# Patient Record
Sex: Male | Born: 1965 | Race: Black or African American | Hispanic: No | Marital: Married | Smoking: Current some day smoker
Health system: Southern US, Community
[De-identification: ages and names within clinical notes are randomized; demographics above are authoritative.]

## PROBLEM LIST (undated history)

## (undated) DIAGNOSIS — K219 Gastro-esophageal reflux disease without esophagitis: Secondary | ICD-10-CM

## (undated) DIAGNOSIS — F32A Depression, unspecified: Secondary | ICD-10-CM

## (undated) DIAGNOSIS — I10 Essential (primary) hypertension: Secondary | ICD-10-CM

## (undated) DIAGNOSIS — F419 Anxiety disorder, unspecified: Secondary | ICD-10-CM

## (undated) DIAGNOSIS — F329 Major depressive disorder, single episode, unspecified: Secondary | ICD-10-CM

## (undated) DIAGNOSIS — E119 Type 2 diabetes mellitus without complications: Secondary | ICD-10-CM

## (undated) DIAGNOSIS — R519 Headache, unspecified: Secondary | ICD-10-CM

## (undated) DIAGNOSIS — R51 Headache: Secondary | ICD-10-CM

---

## 2005-07-31 HISTORY — PX: FOOT MASS EXCISION: SHX1663

## 2005-12-13 ENCOUNTER — Ambulatory Visit: Payer: Self-pay

## 2008-03-23 ENCOUNTER — Ambulatory Visit: Payer: Self-pay | Admitting: Chiropractor

## 2008-11-30 IMAGING — MR MRI LUMBAR SPINE WITHOUT CONTRAST
2 series · 36 of 48 positions shown · non-contrast
Comparison: none

REASON FOR EXAM: lumbar and leg pain
COMMENTS:

PROCEDURE:     MR  - MR LUMBAR SPINE WO CONTRAST  - March 23, 2008  [DATE]
RESULT:
HISTORY: Chronic low back pain with radiation into LEFT leg and bilateral
foot numbness.

[Series 5: T2 · axial · 5.0mm · 0.86mm/px · z∈[-157,+161]mm · 24 of 31 slices shown]
[im 1/31]
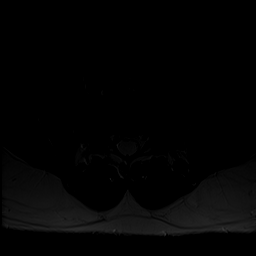
[im 2/31]
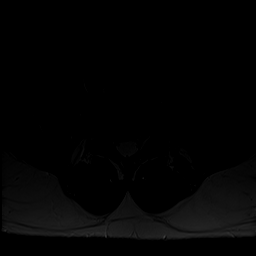
[im 3/31]
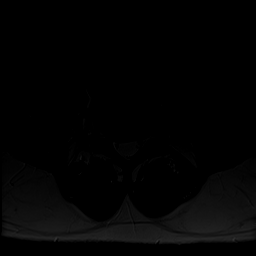
[im 4/31]
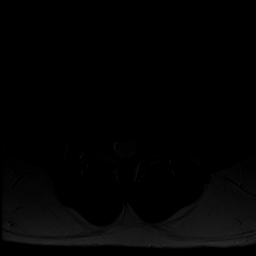
[im 6/31]
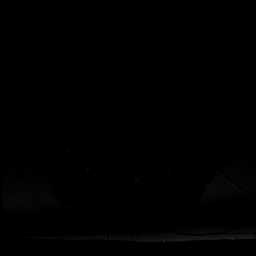
[im 7/31]
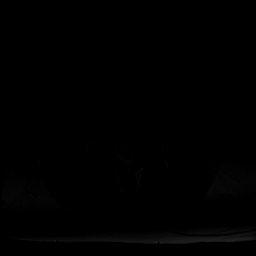
[im 8/31]
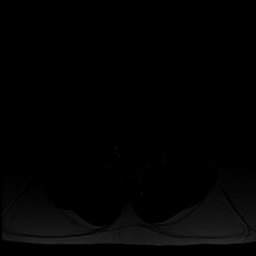
[im 10/31]
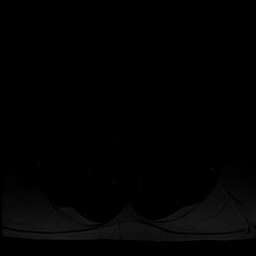
[im 11/31]
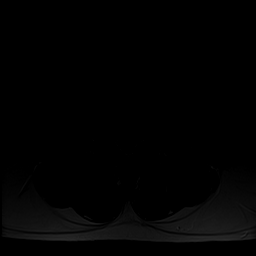
[im 12/31]
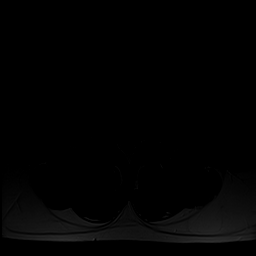
[im 14/31]
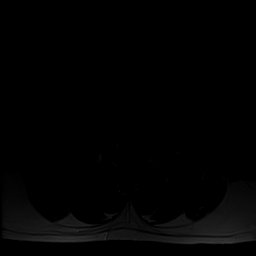
[im 15/31]
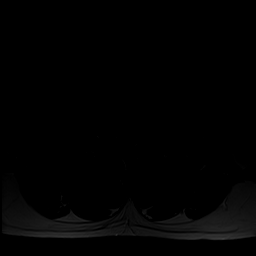
[im 16/31]
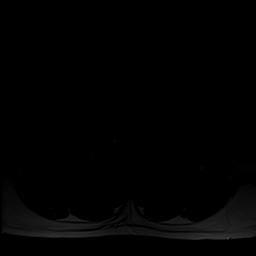
[im 17/31]
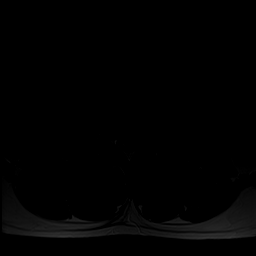
[im 19/31]
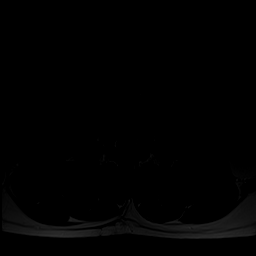
[im 20/31]
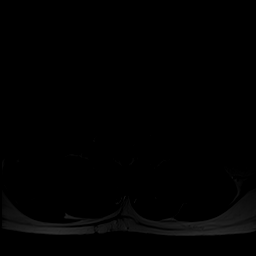
[im 21/31]
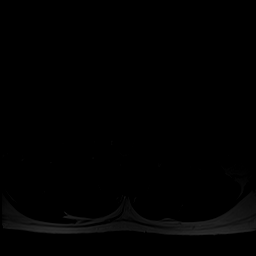
[im 23/31]
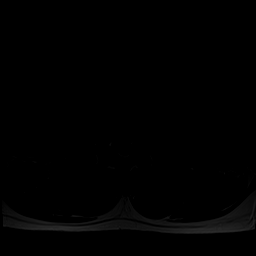
[im 24/31]
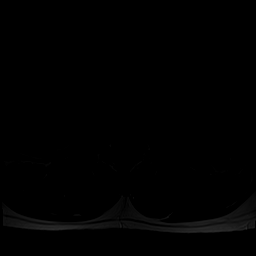
[im 25/31]
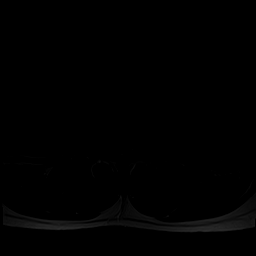
[im 27/31]
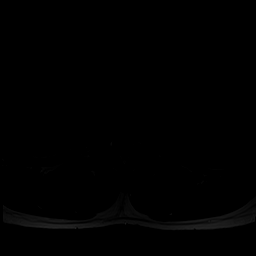
[im 28/31]
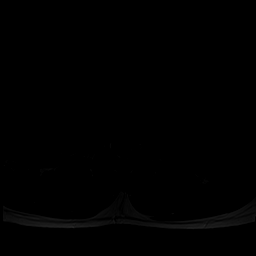
[im 29/31]
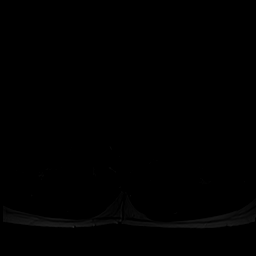
[im 31/31]
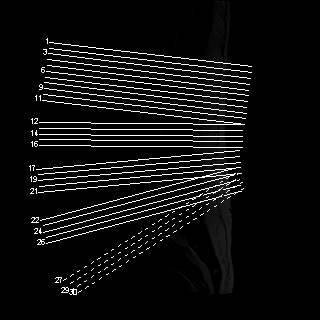

[Series 6: T1 · axial · 5.0mm · 0.43mm/px · z∈[-157,+110]mm · 12 of 31 slices shown]
[im 1/31]
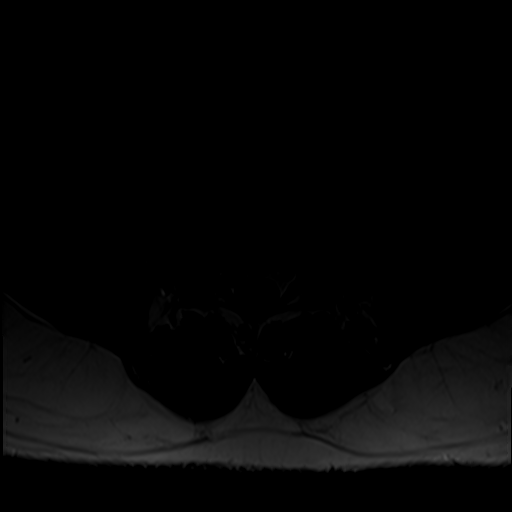
[im 2/31]
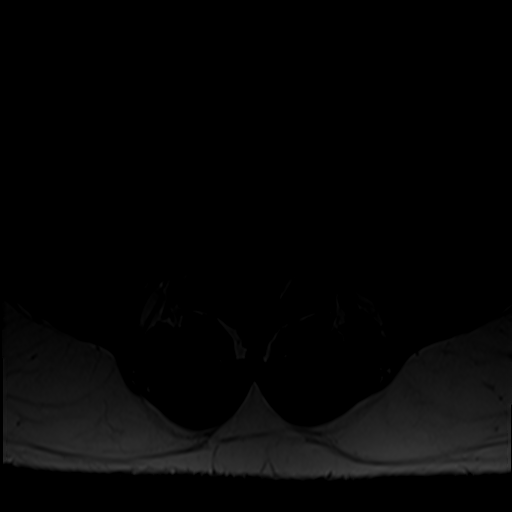
[im 3/31]
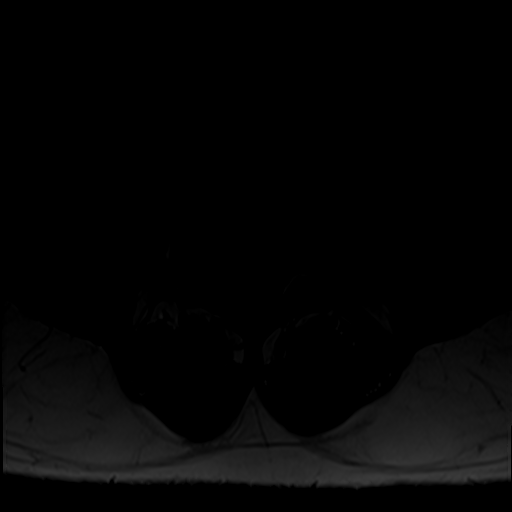
[im 6/31]
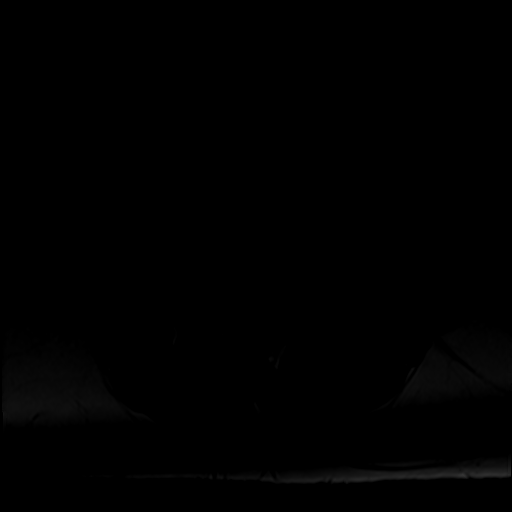
[im 10/31]
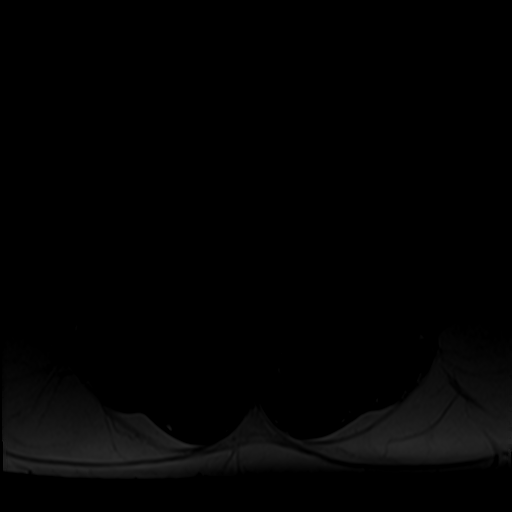
[im 14/31]
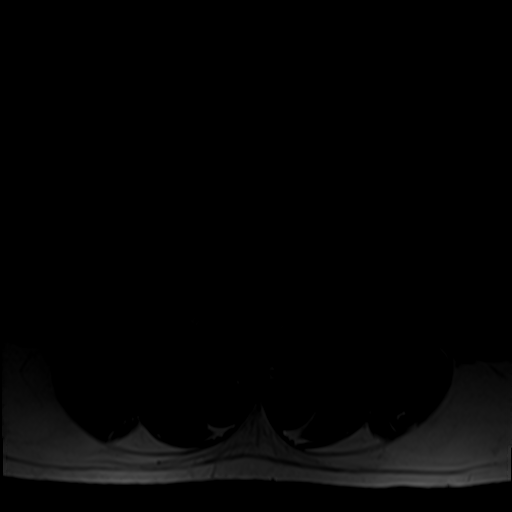
[im 16/31]
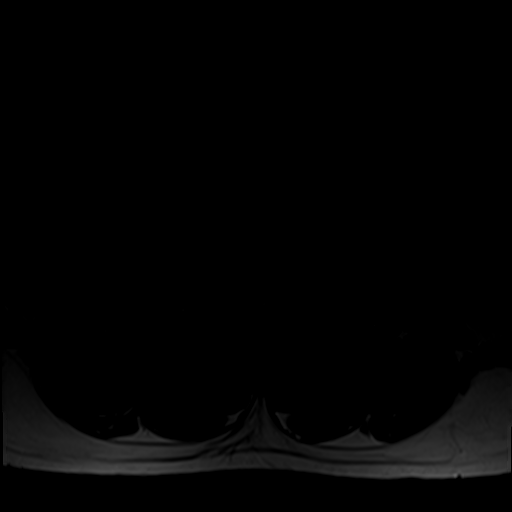
[im 17/31]
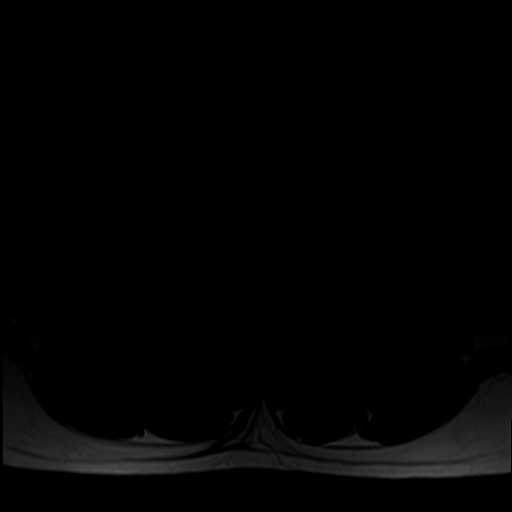
[im 21/31]
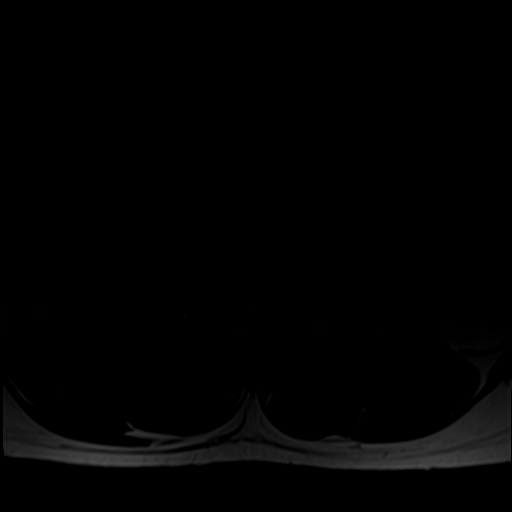
[im 25/31]
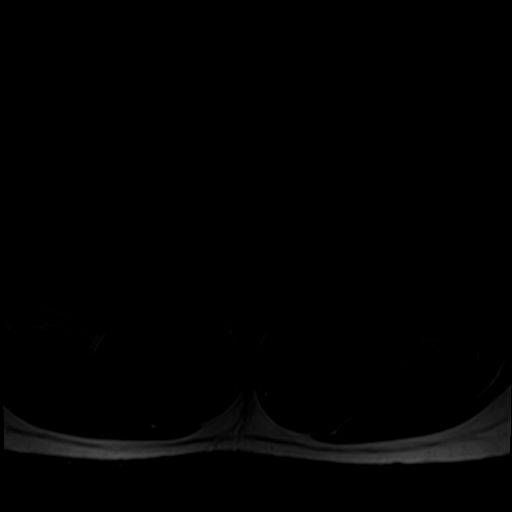
[im 27/31]
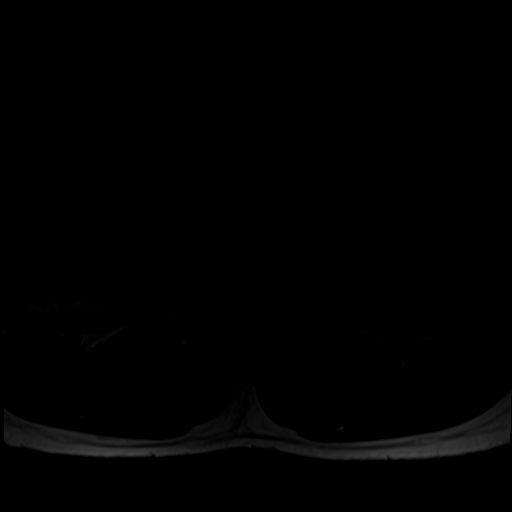
[im 29/31]
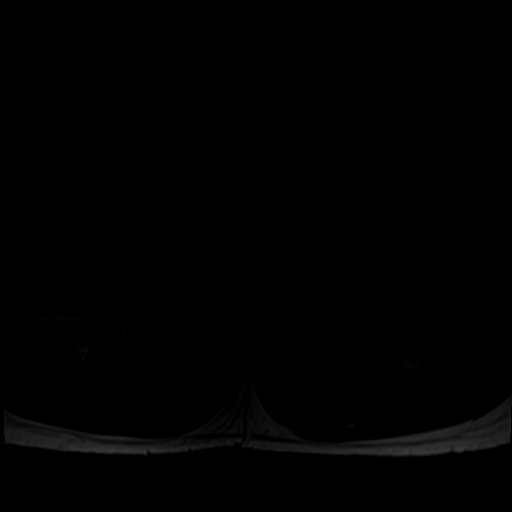

[36 of 48 positions shown; findings below may reference images not displayed]

COMPARISON STUDIES: No recent.

PROCEDURE AND FINDINGS: Multiplanar/multisequence imaging of the lumbar
spine is obtained.  A RIGHT paracentral L1-L2 disc protrusion is present.
This results in mild deformity of the thecal sac at this level. No evidence
of nerve root compression at this level.  A RIGHT paracentral annular bulge
and/or disc protrusion is noted. This along with facetal hypertrophy results
in mild flattening of the RIGHT lateral recess at this level. The neural
foramen is present at this level. Annular bulge with a possible small
central disc protrusion and bilateral facetal hypertrophy is present at
L4-L5 with mild thecal sac deformity and mild bilateral flattening of the
lateral recesses at this level. No high grade stenosis is noted.  Central to
RIGHT paracentral disc protrusion at L5-S1 with flattening of the RIGHT
lateral recess at this level. The lumbar cord is normal. No bony lesion is
noted.
IMPRESSION: 1.Multilevel degenerative disc disease with multilevel annular bulges and
disc protrusions and facetal hypertrophy.  See changes at individual levels
as described above.

## 2010-02-07 ENCOUNTER — Ambulatory Visit: Payer: Self-pay | Admitting: Gastroenterology

## 2010-04-21 ENCOUNTER — Ambulatory Visit: Payer: Self-pay | Admitting: Internal Medicine

## 2010-11-21 ENCOUNTER — Inpatient Hospital Stay: Payer: Self-pay | Admitting: Internal Medicine

## 2012-08-15 ENCOUNTER — Ambulatory Visit: Payer: Self-pay

## 2012-08-25 ENCOUNTER — Ambulatory Visit: Payer: Self-pay

## 2012-09-08 ENCOUNTER — Ambulatory Visit: Payer: Self-pay | Admitting: Physician Assistant

## 2013-08-15 ENCOUNTER — Encounter (INDEPENDENT_AMBULATORY_CARE_PROVIDER_SITE_OTHER): Payer: BC Managed Care – PPO | Admitting: Ophthalmology

## 2013-08-15 DIAGNOSIS — H43819 Vitreous degeneration, unspecified eye: Secondary | ICD-10-CM

## 2013-08-15 DIAGNOSIS — I10 Essential (primary) hypertension: Secondary | ICD-10-CM

## 2013-08-15 DIAGNOSIS — E11319 Type 2 diabetes mellitus with unspecified diabetic retinopathy without macular edema: Secondary | ICD-10-CM

## 2013-08-15 DIAGNOSIS — H35039 Hypertensive retinopathy, unspecified eye: Secondary | ICD-10-CM

## 2013-08-15 DIAGNOSIS — E1165 Type 2 diabetes mellitus with hyperglycemia: Secondary | ICD-10-CM

## 2013-08-15 DIAGNOSIS — E1139 Type 2 diabetes mellitus with other diabetic ophthalmic complication: Secondary | ICD-10-CM

## 2013-08-15 DIAGNOSIS — H33309 Unspecified retinal break, unspecified eye: Secondary | ICD-10-CM

## 2013-08-15 DIAGNOSIS — H2510 Age-related nuclear cataract, unspecified eye: Secondary | ICD-10-CM

## 2015-04-12 ENCOUNTER — Encounter (INDEPENDENT_AMBULATORY_CARE_PROVIDER_SITE_OTHER): Payer: BLUE CROSS/BLUE SHIELD | Admitting: Ophthalmology

## 2015-04-12 DIAGNOSIS — H43813 Vitreous degeneration, bilateral: Secondary | ICD-10-CM | POA: Diagnosis not present

## 2015-04-12 DIAGNOSIS — E11319 Type 2 diabetes mellitus with unspecified diabetic retinopathy without macular edema: Secondary | ICD-10-CM

## 2015-04-12 DIAGNOSIS — H35413 Lattice degeneration of retina, bilateral: Secondary | ICD-10-CM | POA: Diagnosis not present

## 2015-04-12 DIAGNOSIS — I10 Essential (primary) hypertension: Secondary | ICD-10-CM

## 2015-04-12 DIAGNOSIS — E11329 Type 2 diabetes mellitus with mild nonproliferative diabetic retinopathy without macular edema: Secondary | ICD-10-CM | POA: Diagnosis not present

## 2015-04-12 DIAGNOSIS — H35033 Hypertensive retinopathy, bilateral: Secondary | ICD-10-CM

## 2015-04-12 DIAGNOSIS — H33303 Unspecified retinal break, bilateral: Secondary | ICD-10-CM | POA: Diagnosis not present

## 2015-04-12 DIAGNOSIS — H2513 Age-related nuclear cataract, bilateral: Secondary | ICD-10-CM

## 2015-04-21 ENCOUNTER — Encounter (INDEPENDENT_AMBULATORY_CARE_PROVIDER_SITE_OTHER): Payer: BLUE CROSS/BLUE SHIELD | Admitting: Ophthalmology

## 2015-04-21 DIAGNOSIS — H33303 Unspecified retinal break, bilateral: Secondary | ICD-10-CM | POA: Diagnosis not present

## 2015-04-28 ENCOUNTER — Ambulatory Visit (INDEPENDENT_AMBULATORY_CARE_PROVIDER_SITE_OTHER): Payer: BLUE CROSS/BLUE SHIELD | Admitting: Ophthalmology

## 2015-04-28 DIAGNOSIS — H33303 Unspecified retinal break, bilateral: Secondary | ICD-10-CM

## 2015-09-01 ENCOUNTER — Ambulatory Visit (INDEPENDENT_AMBULATORY_CARE_PROVIDER_SITE_OTHER): Payer: BLUE CROSS/BLUE SHIELD | Admitting: Ophthalmology

## 2015-09-01 DIAGNOSIS — H35033 Hypertensive retinopathy, bilateral: Secondary | ICD-10-CM

## 2015-09-01 DIAGNOSIS — I10 Essential (primary) hypertension: Secondary | ICD-10-CM | POA: Diagnosis not present

## 2015-09-01 DIAGNOSIS — E113293 Type 2 diabetes mellitus with mild nonproliferative diabetic retinopathy without macular edema, bilateral: Secondary | ICD-10-CM

## 2015-09-01 DIAGNOSIS — H43813 Vitreous degeneration, bilateral: Secondary | ICD-10-CM | POA: Diagnosis not present

## 2015-09-01 DIAGNOSIS — H33303 Unspecified retinal break, bilateral: Secondary | ICD-10-CM | POA: Diagnosis not present

## 2015-09-01 DIAGNOSIS — E11319 Type 2 diabetes mellitus with unspecified diabetic retinopathy without macular edema: Secondary | ICD-10-CM

## 2015-09-01 DIAGNOSIS — H2513 Age-related nuclear cataract, bilateral: Secondary | ICD-10-CM

## 2015-10-11 ENCOUNTER — Ambulatory Visit: Payer: Self-pay | Admitting: Family Medicine

## 2016-03-22 ENCOUNTER — Other Ambulatory Visit: Payer: Self-pay | Admitting: Podiatry

## 2016-03-22 DIAGNOSIS — M722 Plantar fascial fibromatosis: Secondary | ICD-10-CM

## 2016-03-22 DIAGNOSIS — E119 Type 2 diabetes mellitus without complications: Secondary | ICD-10-CM | POA: Diagnosis not present

## 2016-03-31 ENCOUNTER — Ambulatory Visit
Admission: RE | Admit: 2016-03-31 | Discharge: 2016-03-31 | Disposition: A | Discharge status: Home / Self Care | Payer: BLUE CROSS/BLUE SHIELD | Source: Ambulatory Visit | Attending: Podiatry | Admitting: Podiatry

## 2016-03-31 ENCOUNTER — Other Ambulatory Visit: Payer: Self-pay | Admitting: Podiatry

## 2016-03-31 DIAGNOSIS — M79672 Pain in left foot: Secondary | ICD-10-CM | POA: Diagnosis not present

## 2016-03-31 DIAGNOSIS — M722 Plantar fascial fibromatosis: Secondary | ICD-10-CM

## 2016-03-31 LAB — POCT I-STAT CREATININE: CREATININE: 1 mg/dL (ref 0.61–1.24)

## 2016-05-15 ENCOUNTER — Other Ambulatory Visit: Payer: BLUE CROSS/BLUE SHIELD

## 2016-06-05 ENCOUNTER — Ambulatory Visit
Admission: RE | Admit: 2016-06-05 | Discharge: 2016-06-05 | Disposition: A | Discharge status: Home / Self Care | Payer: BLUE CROSS/BLUE SHIELD | Source: Ambulatory Visit | Attending: Podiatry | Admitting: Podiatry

## 2016-06-05 DIAGNOSIS — M722 Plantar fascial fibromatosis: Secondary | ICD-10-CM | POA: Diagnosis not present

## 2016-06-05 DIAGNOSIS — Z01810 Encounter for preprocedural cardiovascular examination: Secondary | ICD-10-CM | POA: Insufficient documentation

## 2016-06-05 DIAGNOSIS — E119 Type 2 diabetes mellitus without complications: Secondary | ICD-10-CM | POA: Insufficient documentation

## 2016-06-05 DIAGNOSIS — I10 Essential (primary) hypertension: Secondary | ICD-10-CM | POA: Insufficient documentation

## 2016-06-05 DIAGNOSIS — Z01812 Encounter for preprocedural laboratory examination: Secondary | ICD-10-CM | POA: Insufficient documentation

## 2016-06-05 HISTORY — DX: Depression, unspecified: F32.A

## 2016-06-05 HISTORY — DX: Anxiety disorder, unspecified: F41.9

## 2016-06-05 HISTORY — DX: Headache: R51

## 2016-06-05 HISTORY — DX: Gastro-esophageal reflux disease without esophagitis: K21.9

## 2016-06-05 HISTORY — DX: Type 2 diabetes mellitus without complications: E11.9

## 2016-06-05 HISTORY — DX: Major depressive disorder, single episode, unspecified: F32.9

## 2016-06-05 HISTORY — DX: Essential (primary) hypertension: I10

## 2016-06-05 HISTORY — DX: Headache, unspecified: R51.9

## 2016-06-05 LAB — BASIC METABOLIC PANEL
Anion gap: 9 (ref 5–15)
BUN: 14 mg/dL (ref 6–20)
CHLORIDE: 106 mmol/L (ref 101–111)
CO2: 27 mmol/L (ref 22–32)
CREATININE: 0.94 mg/dL (ref 0.61–1.24)
Calcium: 9.2 mg/dL (ref 8.9–10.3)
GFR calc Af Amer: >60 mL/min (ref >60)
GFR calc non Af Amer: >60 mL/min (ref >60)
GLUCOSE: 105 mg/dL — AB (ref 65–99)
POTASSIUM: 3.8 mmol/L (ref 3.5–5.1)
Sodium: 142 mmol/L (ref 135–145)

## 2016-06-05 LAB — CBC
HEMATOCRIT: 44.3 % (ref 40.0–52.0)
HEMOGLOBIN: 15 g/dL (ref 13.0–18.0)
MCH: 27.6 pg (ref 26.0–34.0)
MCHC: 33.8 g/dL (ref 32.0–36.0)
MCV: 81.4 fL (ref 80.0–100.0)
PLATELETS: 185 10*3/uL (ref 150–440)
RBC: 5.44 MIL/uL (ref 4.40–5.90)
RDW: 16.5 % — ABNORMAL HIGH (ref 11.5–14.5)
WBC: 5.9 10*3/uL (ref 3.8–10.6)

## 2016-06-05 LAB — DIFFERENTIAL
Basophils Absolute: 0.1 10*3/uL (ref 0–0.1)
Basophils Relative: 1 %
EOS PCT: 2 %
Eosinophils Absolute: 0.1 10*3/uL (ref 0–0.7)
LYMPHS ABS: 1.7 10*3/uL (ref 1.0–3.6)
LYMPHS PCT: 30 %
MONO ABS: 0.5 10*3/uL (ref 0.2–1.0)
MONOS PCT: 9 %
NEUTROS ABS: 3.4 10*3/uL (ref 1.4–6.5)
Neutrophils Relative %: 58 %

## 2016-06-05 NOTE — Patient Instructions (Signed)
  Your procedure is scheduled [id number] Nov. 10 , 2017. Report to Same Day Surgery. To find out your arrival time please call [phone] between 1PM - 3PM on Thursday Nov. 9, 2017.  Remember: Instructions that are not followed completely may result in serious medical risk, up to and including death, or upon the discretion of your surgeon and anesthesiologist your surgery may need to be rescheduled.    _x___ 1. Do not eat food or drink liquids after midnight. No gum chewing or hard candies.     _x_ 2. No Alcohol for 24 hours before or after surgery.   ____ 3. Bring all medications with you on the day of surgery if instructed.    __x__ 4. Notify your doctor if there is any change in your medical condition     (cold, fever, infections).    __x___ 5. No smoking 24 hours prior to surgery.     Do not wear jewelry, make-up, hairpins, clips or nail polish.  Do not wear lotions, powders, or perfumes.   Do not shave 48 hours prior to surgery. Men may shave face and neck.  Do not bring valuables to the hospital.    [facility] is not responsible for any belongings or valuables.               Contacts, dentures or bridgework may not be worn into surgery.  Leave your suitcase in the car. After surgery it may be brought to your room.  For patients admitted to the hospital, discharge time is determined by your treatment team.   Patients discharged the day of surgery will not be allowed to drive home.    Please read over the following fact sheets that you were given:   [facility] Preparing for Surgery  __x__ Take these medicines the morning of surgery with A SIP OF WATER:    1. amLODipine (NORVASC)  2. omeprazole (PRILOSEC)  3. quinapril (ACCUPRIL)   ____ Fleet Enema (as directed)   __x__ Use CHG Soap as directed on instruction sheet  ____ Use inhalers on the day of surgery and bring to hospital day of surgery  __x__ Stop Janumet/metformin 2 days prior to surgery    ____ Take 1/2  of usual insulin dose the night before surgery and none on the morning of surgery.   ____ Stop Coumadin/Plavix/aspirin on does not apply.  _x__ Stop Anti-inflammatories such as :etodolac (LODINE XL),  Advil, Aleve, Ibuprofen, Motrin, Naproxen, Naprosyn, Goodies powders or aspirin products.  OK to take or Tramadol or  Percocet orTylenol.   _X___ Stop supplements;Melatonin-Pyridoxine   until after surgery.    ____ Bring C-Pap to the hospital.

## 2016-06-06 DIAGNOSIS — M722 Plantar fascial fibromatosis: Secondary | ICD-10-CM | POA: Diagnosis not present

## 2016-06-06 LAB — HEMOGLOBIN A1C
HEMOGLOBIN A1C: 7.6 % — AB (ref 4.8–5.6)
MEAN PLASMA GLUCOSE: 171 mg/dL

## 2016-06-06 NOTE — Pre-Procedure Instructions (Signed)
Requested H&P.

## 2016-06-06 NOTE — Pre-Procedure Instructions (Signed)
HGB A1C sent to Dr. Cleda Mccreedy for review.

## 2016-06-09 ENCOUNTER — Encounter
Admission: RE | Disposition: A | Discharge status: Home / Self Care | Payer: Self-pay | Source: Ambulatory Visit | Attending: Podiatry

## 2016-06-09 ENCOUNTER — Ambulatory Visit
Admission: RE | Admit: 2016-06-09 | Discharge: 2016-06-09 | Disposition: A | Discharge status: Home / Self Care | Payer: BLUE CROSS/BLUE SHIELD | Source: Ambulatory Visit | Attending: Podiatry | Admitting: Podiatry

## 2016-06-09 ENCOUNTER — Ambulatory Visit: Payer: BLUE CROSS/BLUE SHIELD | Admitting: Registered Nurse

## 2016-06-09 DIAGNOSIS — K219 Gastro-esophageal reflux disease without esophagitis: Secondary | ICD-10-CM | POA: Diagnosis not present

## 2016-06-09 DIAGNOSIS — F329 Major depressive disorder, single episode, unspecified: Secondary | ICD-10-CM | POA: Insufficient documentation

## 2016-06-09 DIAGNOSIS — D2122 Benign neoplasm of connective and other soft tissue of left lower limb, including hip: Secondary | ICD-10-CM | POA: Insufficient documentation

## 2016-06-09 DIAGNOSIS — F419 Anxiety disorder, unspecified: Secondary | ICD-10-CM | POA: Diagnosis not present

## 2016-06-09 DIAGNOSIS — F418 Other specified anxiety disorders: Secondary | ICD-10-CM | POA: Diagnosis not present

## 2016-06-09 DIAGNOSIS — I10 Essential (primary) hypertension: Secondary | ICD-10-CM | POA: Insufficient documentation

## 2016-06-09 DIAGNOSIS — M722 Plantar fascial fibromatosis: Secondary | ICD-10-CM | POA: Insufficient documentation

## 2016-06-09 DIAGNOSIS — E119 Type 2 diabetes mellitus without complications: Secondary | ICD-10-CM | POA: Diagnosis not present

## 2016-06-09 DIAGNOSIS — R2242 Localized swelling, mass and lump, left lower limb: Secondary | ICD-10-CM | POA: Diagnosis not present

## 2016-06-09 DIAGNOSIS — Z7984 Long term (current) use of oral hypoglycemic drugs: Secondary | ICD-10-CM | POA: Diagnosis not present

## 2016-06-09 HISTORY — PX: PLANTAR FASCIA RELEASE: SHX2239

## 2016-06-09 LAB — GLUCOSE, CAPILLARY
Glucose-Capillary: 159 mg/dL — ABNORMAL HIGH (ref 65–99)
Glucose-Capillary: 123 mg/dL — ABNORMAL HIGH (ref 65–99)

## 2016-06-09 SURGERY — RELEASE, FASCIA, PLANTAR
Anesthesia: General | Laterality: Left

## 2016-06-09 MED ORDER — PROPOFOL 10 MG/ML IV BOLUS
INTRAVENOUS | Status: DC | PRN
  Administered 2016-06-09: 200 mg via INTRAVENOUS

## 2016-06-09 MED ORDER — BUPIVACAINE HCL (PF) 0.5 % IJ SOLN
INTRAMUSCULAR | Status: DC | PRN
  Administered 2016-06-09: 20 mL

## 2016-06-09 MED ORDER — DEXAMETHASONE SODIUM PHOSPHATE 10 MG/ML IJ SOLN
INTRAMUSCULAR | Status: AC
  Filled 2016-06-09: qty 1

## 2016-06-09 MED ORDER — ONDANSETRON HCL 4 MG/2ML IJ SOLN
4.0000 mg | Freq: Once | INTRAMUSCULAR | Status: AC | PRN
  Administered 2016-06-09: 4 mg via INTRAVENOUS

## 2016-06-09 MED ORDER — FAMOTIDINE 20 MG PO TABS
ORAL_TABLET | ORAL | Status: AC
  Administered 2016-06-09: 20 mg
  Filled 2016-06-09: qty 1

## 2016-06-09 MED ORDER — OXYCODONE-ACETAMINOPHEN 5-325 MG PO TABS: 1.0000 | ORAL_TABLET | ORAL | Status: DC | PRN

## 2016-06-09 MED ORDER — CEFAZOLIN SODIUM-DEXTROSE 2-4 GM/100ML-% IV SOLN
INTRAVENOUS | Status: AC
  Filled 2016-06-09: qty 100

## 2016-06-09 MED ORDER — ACETAMINOPHEN 10 MG/ML IV SOLN
INTRAVENOUS | Status: AC
  Filled 2016-06-09: qty 100

## 2016-06-09 MED ORDER — LIDOCAINE HCL (CARDIAC) 20 MG/ML IV SOLN
INTRAVENOUS | Status: DC | PRN
  Administered 2016-06-09: 80 mg via INTRAVENOUS

## 2016-06-09 MED ORDER — LIDOCAINE HCL (PF) 1 % IJ SOLN
INTRAMUSCULAR | Status: AC
Start: 2016-06-09 — End: 2016-06-09
  Filled 2016-06-09: qty 30

## 2016-06-09 MED ORDER — SODIUM CHLORIDE 0.9 % IV SOLN
INTRAVENOUS | Status: DC
  Administered 2016-06-09: 07:00:00 via INTRAVENOUS

## 2016-06-09 MED ORDER — FENTANYL CITRATE (PF) 100 MCG/2ML IJ SOLN
25.0000 ug | INTRAMUSCULAR | Status: DC | PRN
  Administered 2016-06-09 (×2): 25 ug via INTRAVENOUS

## 2016-06-09 MED ORDER — BUPIVACAINE HCL (PF) 0.5 % IJ SOLN
INTRAMUSCULAR | Status: AC
  Filled 2016-06-09: qty 30

## 2016-06-09 MED ORDER — FENTANYL CITRATE (PF) 100 MCG/2ML IJ SOLN
INTRAMUSCULAR | Status: DC | PRN
  Administered 2016-06-09: 25 ug via INTRAVENOUS
  Administered 2016-06-09: 50 ug via INTRAVENOUS
  Administered 2016-06-09 (×2): 25 ug via INTRAVENOUS
  Administered 2016-06-09: 50 ug via INTRAVENOUS
  Administered 2016-06-09: 25 ug via INTRAVENOUS

## 2016-06-09 MED ORDER — OXYCODONE-ACETAMINOPHEN 5-325 MG PO TABS: 1.0000 | ORAL_TABLET | ORAL | 0 refills | Status: DC | PRN

## 2016-06-09 MED ORDER — ACETAMINOPHEN 10 MG/ML IV SOLN
INTRAVENOUS | Status: DC | PRN
  Administered 2016-06-09: 1000 mg via INTRAVENOUS

## 2016-06-09 MED ORDER — ONDANSETRON HCL 4 MG/2ML IJ SOLN
INTRAMUSCULAR | Status: DC | PRN
  Administered 2016-06-09: 4 mg via INTRAVENOUS

## 2016-06-09 MED ORDER — CEFAZOLIN SODIUM-DEXTROSE 2-4 GM/100ML-% IV SOLN
2.0000 g | Freq: Once | INTRAVENOUS | Status: AC
  Administered 2016-06-09: 2 g via INTRAVENOUS

## 2016-06-09 MED ORDER — FAMOTIDINE 20 MG PO TABS: 20.0000 mg | ORAL_TABLET | Freq: Once | ORAL | Status: DC

## 2016-06-09 MED ORDER — METOCLOPRAMIDE HCL 5 MG/ML IJ SOLN
INTRAMUSCULAR | Status: AC
  Filled 2016-06-09: qty 2

## 2016-06-09 MED ORDER — ONDANSETRON HCL 4 MG/2ML IJ SOLN
INTRAMUSCULAR | Status: AC
  Filled 2016-06-09: qty 2

## 2016-06-09 MED ORDER — METOCLOPRAMIDE HCL 5 MG/ML IJ SOLN
10.0000 mg | Freq: Once | INTRAMUSCULAR | Status: AC
  Administered 2016-06-09: 10 mg via INTRAVENOUS

## 2016-06-09 MED ORDER — KETOROLAC TROMETHAMINE 30 MG/ML IJ SOLN
INTRAMUSCULAR | Status: DC | PRN
  Administered 2016-06-09: 30 mg via INTRAVENOUS

## 2016-06-09 MED ORDER — FENTANYL CITRATE (PF) 100 MCG/2ML IJ SOLN
INTRAMUSCULAR | Status: DC
Start: 2016-06-09 — End: 2016-06-09
  Filled 2016-06-09: qty 2

## 2016-06-09 MED ORDER — MIDAZOLAM HCL 2 MG/2ML IJ SOLN
INTRAMUSCULAR | Status: DC | PRN
Start: 2016-06-09 — End: 2016-06-09
  Administered 2016-06-09: 2 mg via INTRAVENOUS

## 2016-06-09 MED ORDER — DEXAMETHASONE SODIUM PHOSPHATE 10 MG/ML IJ SOLN
10.0000 mg | Freq: Once | INTRAMUSCULAR | Status: AC
  Administered 2016-06-09: 10 mg via INTRAVENOUS

## 2016-06-09 SURGICAL SUPPLY — 34 items
BANDAGE ELASTIC 4 LF NS (GAUZE/BANDAGES/DRESSINGS) ×4 IMPLANT
BANDAGE STRETCH 3X4.1 STRL (GAUZE/BANDAGES/DRESSINGS) ×2 IMPLANT
BNDG ESMARK 4X12 TAN STRL LF (GAUZE/BANDAGES/DRESSINGS) ×2 IMPLANT
BNDG GAUZE 4.5X4.1 6PLY STRL (MISCELLANEOUS) ×2 IMPLANT
CANISTER SUCT 1200ML W/VALVE (MISCELLANEOUS) ×2 IMPLANT
CUFF TOURN SGL QUICK 18 (TOURNIQUET CUFF) ×2 IMPLANT
CUFF TOURN SGL QUICK 24 (TOURNIQUET CUFF)
CUFF TRNQT CYL 24X4X40X1 (TOURNIQUET CUFF) IMPLANT
DURAPREP 26ML APPLICATOR (WOUND CARE) ×2 IMPLANT
ELECT REM PT RETURN 9FT ADLT (ELECTROSURGICAL) ×2
ELECTRODE REM PT RTRN 9FT ADLT (ELECTROSURGICAL) ×1 IMPLANT
GAUZE PETRO XEROFOAM 1X8 (MISCELLANEOUS) ×2 IMPLANT
GAUZE SPONGE 4X4 12PLY STRL (GAUZE/BANDAGES/DRESSINGS) ×2 IMPLANT
GAUZE STRETCH 2X75IN STRL (MISCELLANEOUS) ×2 IMPLANT
GLOVE BIO SURGEON STRL SZ7.5 (GLOVE) ×2 IMPLANT
GLOVE INDICATOR 8.0 STRL GRN (GLOVE) ×2 IMPLANT
GOWN STRL REUS W/ TWL LRG LVL3 (GOWN DISPOSABLE) ×2 IMPLANT
GOWN STRL REUS W/TWL LRG LVL3 (GOWN DISPOSABLE) ×2
KIT RM TURNOVER STRD PROC AR (KITS) ×2 IMPLANT
LABEL OR SOLS (LABEL) IMPLANT
NDL SAFETY ECLIPSE 18X1.5 (NEEDLE) ×1 IMPLANT
NEEDLE HYPO 18GX1.5 SHARP (NEEDLE) ×1
NEEDLE HYPO 25X1 1.5 SAFETY (NEEDLE) ×2 IMPLANT
NS IRRIG 500ML POUR BTL (IV SOLUTION) ×2 IMPLANT
PACK EXTREMITY ARMC (MISCELLANEOUS) ×2 IMPLANT
PAD PREP 24X41 OB/GYN DISP (PERSONAL CARE ITEMS) ×2 IMPLANT
PENCIL ELECTRO HAND CTR (MISCELLANEOUS) ×2 IMPLANT
STOCKINETTE M/LG 89821 (MISCELLANEOUS) ×2 IMPLANT
SUT ETHILON 4-0 (SUTURE) ×2
SUT ETHILON 4-0 FS2 18XMFL BLK (SUTURE) ×2
SUT ETHILON NAB PS2 4-0 18IN (SUTURE) ×2 IMPLANT
SUT VIC AB 4-0 FS2 27 (SUTURE) ×8 IMPLANT
SUTURE ETHLN 4-0 FS2 18XMF BLK (SUTURE) ×2 IMPLANT
SYRINGE 10CC LL (SYRINGE) ×2 IMPLANT

## 2016-06-09 NOTE — Anesthesia Postprocedure Evaluation (Signed)
Anesthesia Post Note  Patient: Clinton Moore  Procedure(s) Performed: Procedure(s) (LRB): Radical excision plantar fibromas of left plantar arch and left hallux (Left)  Patient location during evaluation: PACU Anesthesia Type: General Level of consciousness: awake and alert Pain management: pain level controlled Vital Signs Assessment: post-procedure vital signs reviewed and stable Respiratory status: spontaneous breathing and respiratory function stable Cardiovascular status: stable Anesthetic complications: no    Last Vitals:  Vitals:   06/09/16 0620 06/09/16 0940  BP: (!) 146/109 118/76  Pulse: 97 85  Resp: 18 14  Temp: 36.3 C 36.2 C    Last Pain:  Vitals:   06/09/16 0620  TempSrc: Tympanic                 KEPHART,Ziyad K

## 2016-06-09 NOTE — Anesthesia Preprocedure Evaluation (Signed)
Anesthesia Evaluation  Patient identified by MRN, date of birth, ID band Patient awake    Reviewed: Allergy & Precautions, NPO status , Patient's Chart, lab work & pertinent test results  History of Anesthesia Complications Negative for: history of anesthetic complications  Airway Mallampati: II       Dental   Pulmonary neg pulmonary ROS, Current Smoker,           Cardiovascular hypertension, Pt. on medications      Neuro/Psych Anxiety Depression    GI/Hepatic Neg liver ROS, GERD  Medicated,  Endo/Other  diabetes, Type 2, Oral Hypoglycemic Agents  Renal/GU negative Renal ROS     Musculoskeletal   Abdominal   Peds  Hematology negative hematology ROS (+)   Anesthesia Other Findings   Reproductive/Obstetrics                             Anesthesia Physical Anesthesia Plan  ASA: III  Anesthesia Plan: General   Post-op Pain Management:    Induction: Intravenous  Airway Management Planned:   Additional Equipment:   Intra-op Plan:   Post-operative Plan:   Informed Consent: I have reviewed the patients History and Physical, chart, labs and discussed the procedure including the risks, benefits and alternatives for the proposed anesthesia with the patient or authorized representative who has indicated his/her understanding and acceptance.     Plan Discussed with:   Anesthesia Plan Comments:         Anesthesia Quick Evaluation

## 2016-06-09 NOTE — Op Note (Signed)
Date of operation: 06/10/2015.  Surgeon: Durward Fortes DPM.  Preoperative diagnosis: 1. Multifocal plantar fibroma left arch. 2. Plantar fibroma left hallux area.  Postoperative diagnosis: Same pending pathology.  Procedures: 1. Radical excision plantar fascia and fibroma left arch. 2. Excision plantar fibroma left hallux.  Anesthesia: LMA with local.  Hemostasis: Pneumatic tourniquet left ankle 250 mmHg.  Estimated blood loss: Less than 5 cc.  Pathology: 1. Soft tissue fibroma left arch. 2. Soft tissue fibroma left hallux.  Injectables: 20 cc 0.5% bupivacaine plain.  Complications: None apparent.  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 Conservative care has been unsuccessful in maintaining his symptoms and he elects for surgical removal.  Operative procedure: Patient was taken to the operating room and placed on the table in supine position. Following satisfactory LMA anesthesia a pneumatic tourniquet was applied at the level of the left ankle. The foot was prepped and draped in usual sterile fashion. Foot was exsanguinated and the tourniquet inflated to 250 mmHg. Attention was then directed to the plantar aspect of the left arch where a Z-shaped in decision was made on the plantar arch with the apex central in the mid arch and the distal apex medial just proximal to the first metatarsophalangeal joint. The proximal 2 arms of the Z were approximately 4 cm and length and the distal most arm approximately 1.5 cm. The incision was deepened sharply through the skin down to the subcutaneous layers. Careful dissection was performed to remove a large multi lobulated plantar fibroma from the underlying skin and soft tissues. Dissection was carried distal medial as well as proximal lateral revealing 2 large fibromas in the medial and central band with a smaller third area in the central to lateral band.  The lesions were carefully separated from the surrounding tissues as well as the underlying muscle belly and neurovascular structures and then carefully resected trying to maintain clean margins around the entire lesion. The lesion was removed in toto. The wound was then flushed with copious amounts of sterile saline and closed using 4-0 Vicryl simple interrupted sutures for deeper reapproximation followed by skin reapproximation using 4-0 nylon simple interrupted sutures with a modified horizontal mattress suture at the apex of the incisions. Attention then was directed to the plantar aspect of the left hallux where an approximate 2 cm linear incision was made along the central plantar aspect of the great toe area and incision was deepened through the skin down to the subcutaneous layer where a fibroma was freely identified. Careful dissection with tenotomy scissors was performed to free the lesion from the surrounding normal anatomy. The lesion was then resected in toto. The wound was flushed with copious amounts of sterile saline and closed using 4-0 Vicryl suture for deep reapproximation followed by skin closure using or 0 nylon supple interrupted sutures. 20 cc of 0.5% bupivacaine plain were then injected through the left arch area. Betadine applied to the incisions followed by Xeroform 4 x 4's and a conformer and Kerlix bandage. Tourniquet was released and blood flow noted return to medially to the left foot and all digits. An Ace wrap was then applied for compression. Patient tolerated the procedure and anesthesia well and was transported to the PACU with vital signs stable and in good condition.

## 2016-06-09 NOTE — H&P (Signed)
History and physical in the chart is reviewed. No interval changes. Patient stable for surgery  

## 2016-06-09 NOTE — Transfer of Care (Signed)
Immediate Anesthesia Transfer of Care Note  Patient: Clinton Moore  Procedure(s) Performed: Procedure(s): Radical excision plantar fibromas of left plantar arch and left hallux (Left)  Patient Location: PACU  Anesthesia Type:General  Level of Consciousness: sedated  Airway & Oxygen Therapy: Patient Spontanous Breathing and Patient connected to face mask oxygen  Post-op Assessment: Report given to RN and Post -op Vital signs reviewed and stable  Post vital signs: Reviewed and stable  Last Vitals:  Vitals:   06/09/16 0620  BP: (!) 146/109  Pulse: 97  Resp: 18  Temp: 36.3 C    Last Pain:  Vitals:   06/09/16 0620  TempSrc: Tympanic         Complications: No apparent anesthesia complications

## 2016-06-09 NOTE — Discharge Instructions (Addendum)
AMBULATORY SURGERY  DISCHARGE INSTRUCTIONS   1) The drugs that you were given will stay in your system until tomorrow so for the next 24 hours you should not:  A) Drive an automobile B) Make any legal decisions C) Drink any alcoholic beverage   2) You may resume regular meals tomorrow.  Today it is better to start with liquids and gradually work up to solid foods.  You may eat anything you prefer, but it is better to start with liquids, then soup and crackers, and gradually work up to solid foods.   3) Please notify your doctor immediately if you have any unusual bleeding, trouble breathing, redness and pain at the surgery site, drainage, fever, or pain not relieved by medication.    4) Additional Instructions:        Please contact your physician with any problems or Same Day Surgery at [phone], Monday through Friday 6 am to 4 pm, or [place] at [facility] number at [phone]. Elevate left lower extremity on 2 pillows.  2. Keep the bandage on the left foot clean, dry, and do not remove.  3. Sponge bathe only left lower extremity.  4. No weight on left foot using crutches.  5. Take one or 2 pain pills, Percocet, every 4 hours if needed for pain

## 2016-06-09 NOTE — Anesthesia Procedure Notes (Signed)
Procedure Name: LMA Insertion Date/Time: 06/09/2016 7:35 AM Performed by: Hedda Slade Pre-anesthesia Checklist: Patient identified, Emergency Drugs available, Suction available and Patient being monitored Patient Re-evaluated:Patient Re-evaluated prior to inductionOxygen Delivery Method: Circle system utilized Preoxygenation: Pre-oxygenation with 100% oxygen Intubation Type: IV induction Ventilation: Mask ventilation without difficulty LMA: LMA inserted LMA Size: 4.5 Number of attempts: 1 Placement Confirmation: positive ETCO2 and breath sounds checked- equal and bilateral Tube secured with: Tape Dental Injury: Teeth and Oropharynx as per pre-operative assessment

## 2016-06-09 NOTE — Interval H&P Note (Signed)
History and Physical Interval Note:  06/09/2016 7:20 AM  Clinton Moore  has presented today for surgery, with the diagnosis of Plantar Fibromas M72.2  The various methods of treatment have been discussed with the patient and family. After consideration of risks, benefits and other options for treatment, the patient has consented to  Procedure(s): PLANTAR FASCIA RELEASE/Radical fasciectomy/fibromas (Left) as a surgical intervention .  The patient's history has been reviewed, patient examined, no change in status, stable for surgery.  I have reviewed the patient's chart and labs.  Questions were answered to the patient's satisfaction.     Durward Fortes

## 2016-06-14 LAB — SURGICAL PATHOLOGY

## 2016-08-08 DIAGNOSIS — M722 Plantar fascial fibromatosis: Secondary | ICD-10-CM | POA: Diagnosis not present

## 2016-08-08 DIAGNOSIS — E1165 Type 2 diabetes mellitus with hyperglycemia: Secondary | ICD-10-CM | POA: Diagnosis not present

## 2016-08-08 DIAGNOSIS — I10 Essential (primary) hypertension: Secondary | ICD-10-CM | POA: Diagnosis not present

## 2016-08-08 DIAGNOSIS — Z0001 Encounter for general adult medical examination with abnormal findings: Secondary | ICD-10-CM | POA: Diagnosis not present

## 2016-08-09 ENCOUNTER — Other Ambulatory Visit: Payer: Self-pay | Admitting: Nurse Practitioner

## 2016-08-09 DIAGNOSIS — M545 Low back pain: Secondary | ICD-10-CM

## 2016-08-16 ENCOUNTER — Ambulatory Visit
Admission: RE | Admit: 2016-08-16 | Discharge: 2016-08-16 | Disposition: A | Discharge status: Home / Self Care | Payer: BLUE CROSS/BLUE SHIELD | Source: Ambulatory Visit | Attending: Nurse Practitioner | Admitting: Nurse Practitioner

## 2016-09-07 ENCOUNTER — Ambulatory Visit (INDEPENDENT_AMBULATORY_CARE_PROVIDER_SITE_OTHER): Payer: BLUE CROSS/BLUE SHIELD | Admitting: Ophthalmology

## 2016-11-07 DIAGNOSIS — F329 Major depressive disorder, single episode, unspecified: Secondary | ICD-10-CM | POA: Diagnosis not present

## 2016-11-07 DIAGNOSIS — E1165 Type 2 diabetes mellitus with hyperglycemia: Secondary | ICD-10-CM | POA: Diagnosis not present

## 2016-11-07 DIAGNOSIS — I10 Essential (primary) hypertension: Secondary | ICD-10-CM | POA: Diagnosis not present

## 2016-11-07 DIAGNOSIS — M545 Low back pain: Secondary | ICD-10-CM | POA: Diagnosis not present

## 2016-11-10 DIAGNOSIS — Z125 Encounter for screening for malignant neoplasm of prostate: Secondary | ICD-10-CM | POA: Diagnosis not present

## 2016-11-10 DIAGNOSIS — E782 Mixed hyperlipidemia: Secondary | ICD-10-CM | POA: Diagnosis not present

## 2016-11-10 DIAGNOSIS — E1165 Type 2 diabetes mellitus with hyperglycemia: Secondary | ICD-10-CM | POA: Diagnosis not present

## 2016-11-10 DIAGNOSIS — Z0001 Encounter for general adult medical examination with abnormal findings: Secondary | ICD-10-CM | POA: Diagnosis not present

## 2016-11-29 DIAGNOSIS — M722 Plantar fascial fibromatosis: Secondary | ICD-10-CM | POA: Diagnosis not present

## 2016-12-08 IMAGING — MR MR FOOT*L* W/O CM
4 of 6 series · 23 of 40 positions shown · non-contrast
Comparison: None.

CLINICAL DATA: Painful cyst of the left great toe for 1 or 2 years.
Plantar fibromatosis IEL.L ([id number])

EXAM:
MRI OF THE LEFT FOOT WITHOUT CONTRAST
TECHNIQUE: Multiplanar, multisequence MR imaging was performed. No intravenous
contrast was administered.

[Series 4: T1 · coronal · 3.0mm · 0.75mm/px · 3 of 36 slices shown]
[im 5/36]
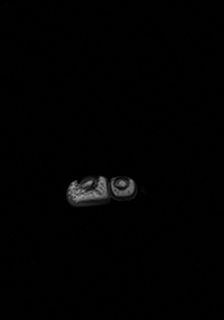
[im 18/36]
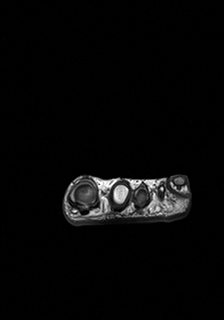
[im 31/36]
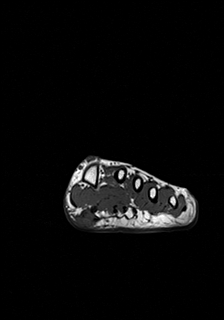

[Series 5: T2 fat-sat · coronal · 3.0mm · 0.43mm/px · 8 of 36 slices shown (1 of 3)]
[im 1/36]
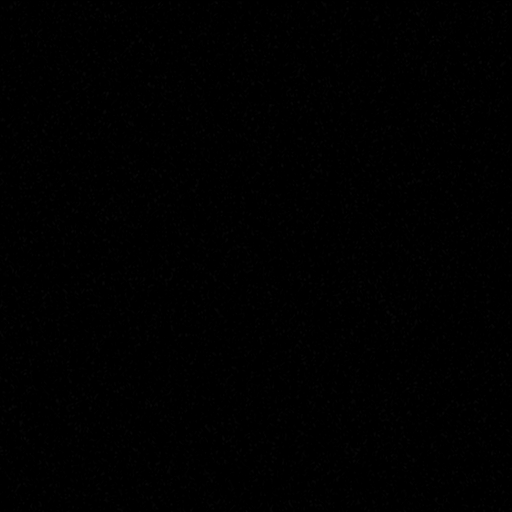
[im 6/36]
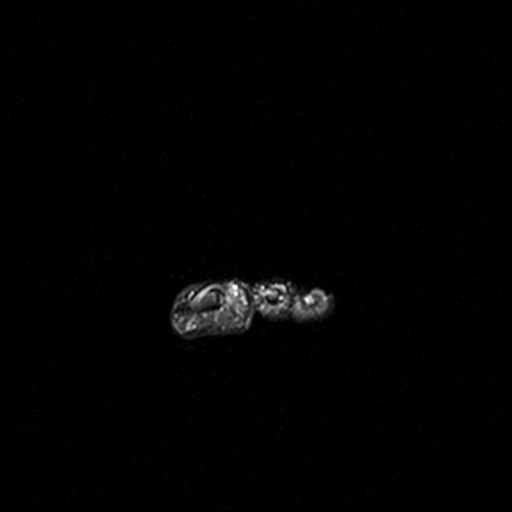
[im 11/36]
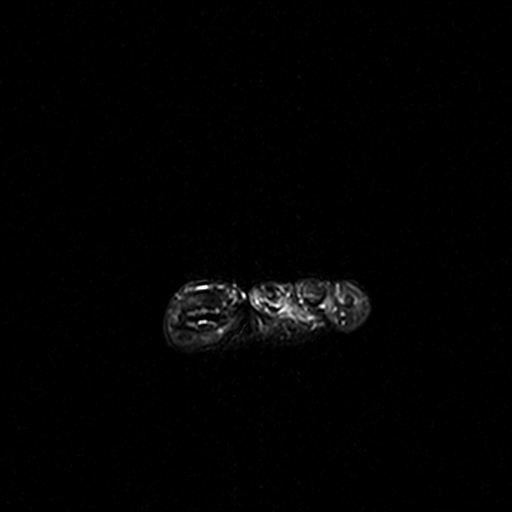
[im 16/36]
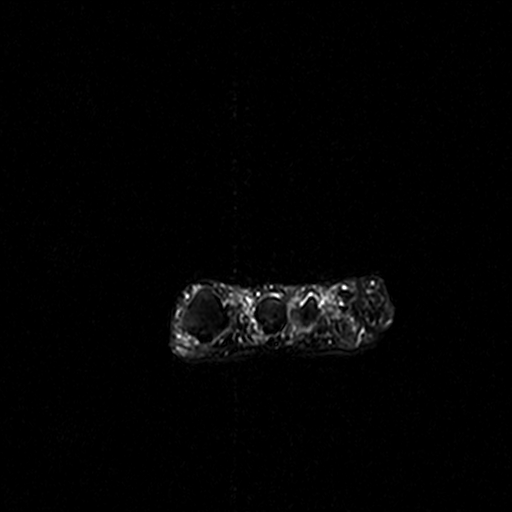
[im 21/36]
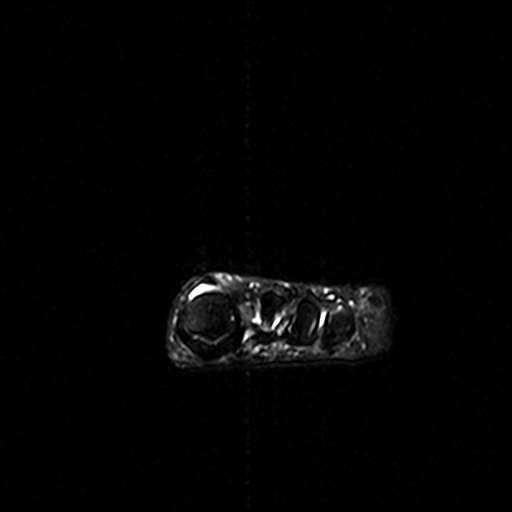
[im 26/36]
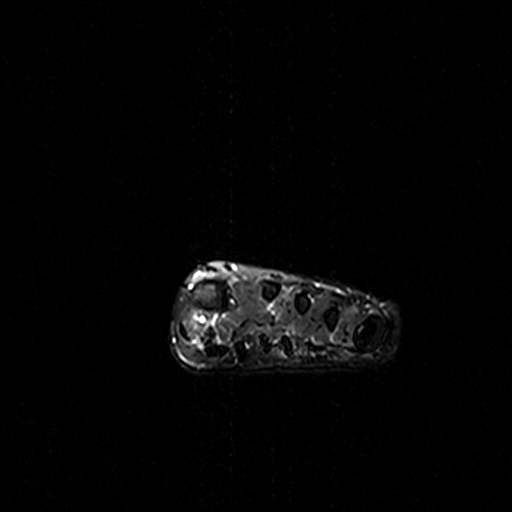
[im 31/36]
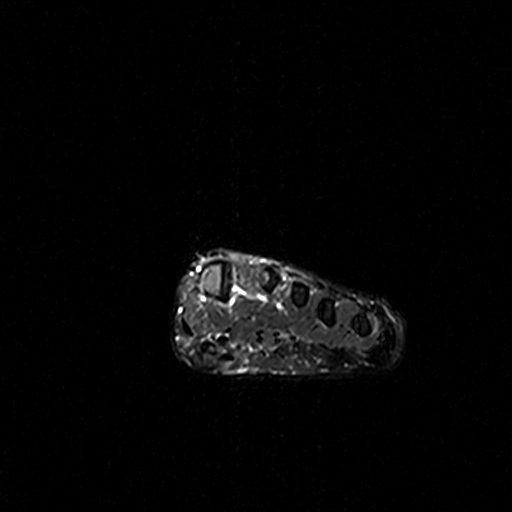
[im 36/36]
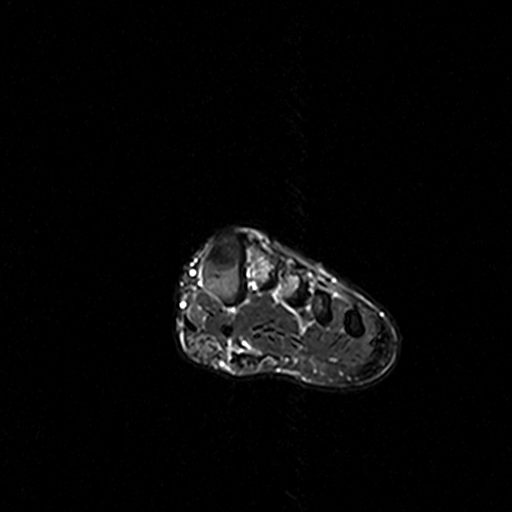

[Series 7: T2 fat-sat · axial · 3.0mm · 0.60mm/px · z∈[-131,-55]mm · 5 of 25 slices shown (2 of 3)]
[im 1/25]
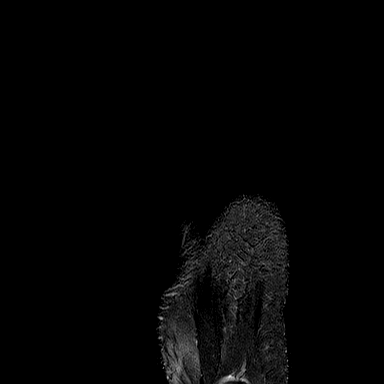
[im 7/25]
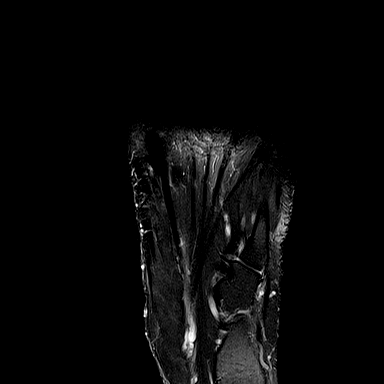
[im 13/25]
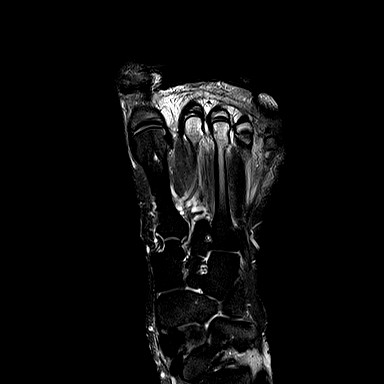
[im 19/25]
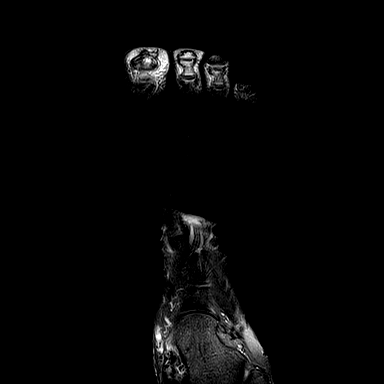
[im 25/25]
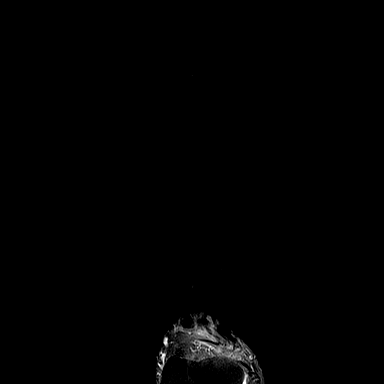

[Series 8: T2 fat-sat · sagittal · 3.0mm · 0.35mm/px · 7 of 35 slices shown (3 of 3)]
[im 1/35]
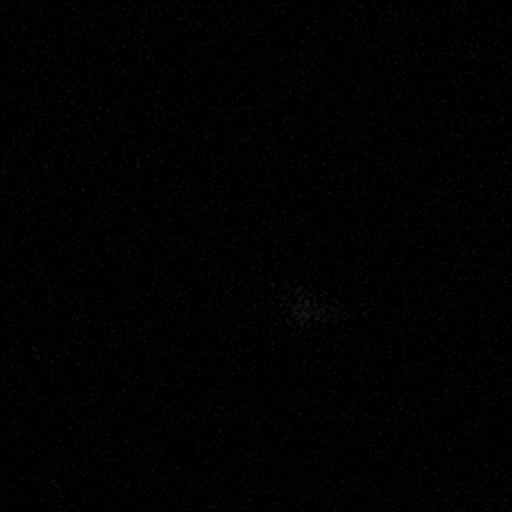
[im 5/35]
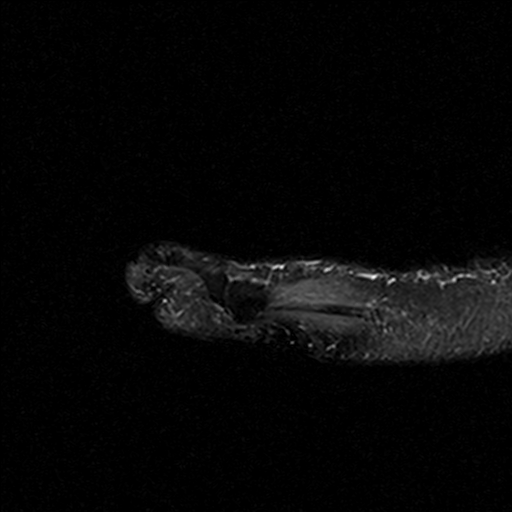
[im 10/35]
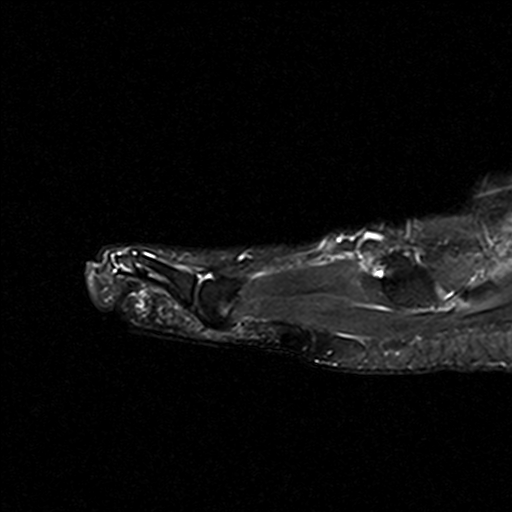
[im 15/35]
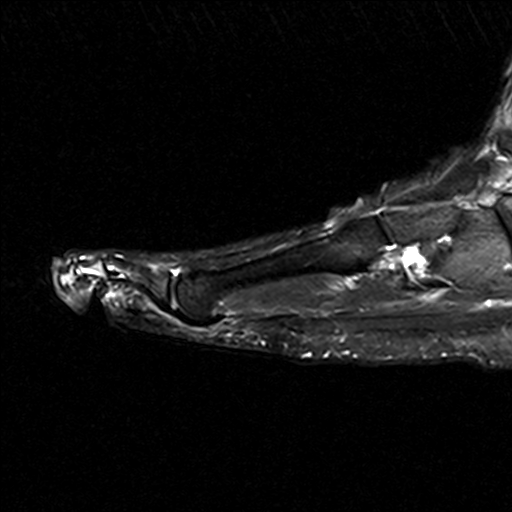
[im 20/35]
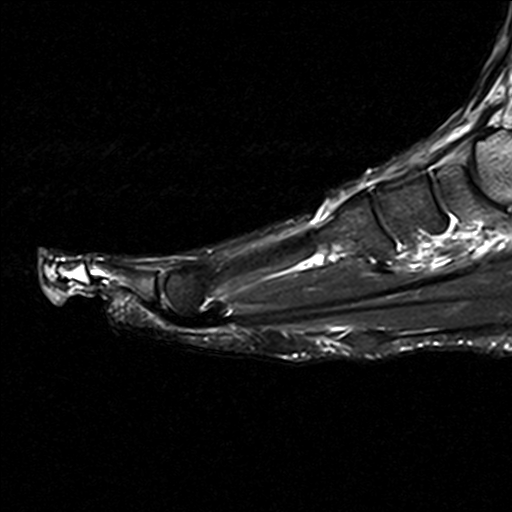
[im 25/35]
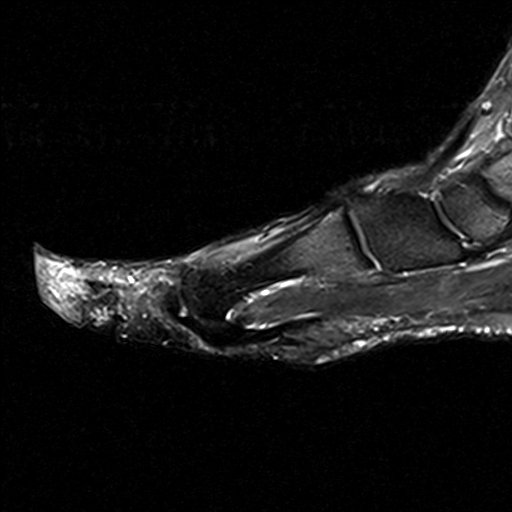
[im 30/35]
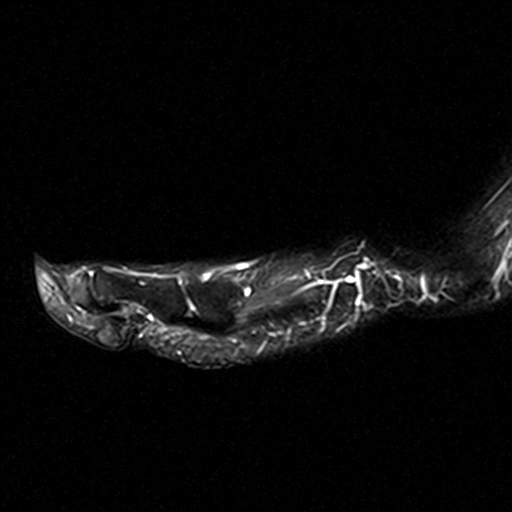

[23 of 40 positions shown; findings below may reference images not displayed]

FINDINGS: Bones/Joint/Cartilage

No evidence of acute fracture or dislocation. There are mild
degenerative changes at the first metatarsal phalangeal joint.
Signal changes in the first metatarsal head may be degenerative or
postsurgical. There are mild degenerative changes at the first
tarsal metatarsal joint. No erosive changes or significant joint
effusions.

Ligaments

The Lisfranc ligament is intact.

Muscles and Tendons
The intrinsic forefoot musculature, flexor and extensor tendons
appear normal. There is thickening of the plantar fascia distally,
measured up to 2.2 x 1.4 cm on image 23 of series 6. Medial to this,
there is an additional globular focus of intermediate T1 and low T2
signal, measuring 3.2 x 1.9 cm on image 21 of series 6. These are
consistent with plantar fibromatosis and do not cause any osseous
erosion. The posterior aspect of the plantar fascia is not
visualized.

Soft tissues
Within the subcutaneous fat plantar to the interphalangeal joint of
the great toe, there is a globular focus of intermediate T1 and low
T2 signal measuring approximately 18 x 27 mm on image 14 of series
6. This is superficial to the flexor hallucis longus tendon and does
not cause any osseous erosion.
IMPRESSION: 1. There are several areas of nodular soft tissue within the plantar
aspect of the forefoot which demonstrate similar imaging
characteristics. Those associated with the distal aspect of the
plantar fascia are most consistent with plantar fibromatosis. More
distal component plantar to the IP joint of the great toe is less
specific, although presumably of the same etiology. Other
considerations for this lesion would include giant cell tumor of the
tendon sheath, gouty tophus and atypical pressure lesion.
2. No osseous erosion or acute osseous findings. Probable
degenerative or postsurgical changes in the first metatarsal head.

## 2016-12-27 DIAGNOSIS — R945 Abnormal results of liver function studies: Secondary | ICD-10-CM | POA: Diagnosis not present

## 2017-02-08 DIAGNOSIS — R945 Abnormal results of liver function studies: Secondary | ICD-10-CM | POA: Diagnosis not present

## 2017-02-08 DIAGNOSIS — I10 Essential (primary) hypertension: Secondary | ICD-10-CM | POA: Diagnosis not present

## 2017-02-08 DIAGNOSIS — E1165 Type 2 diabetes mellitus with hyperglycemia: Secondary | ICD-10-CM | POA: Diagnosis not present

## 2017-03-28 DIAGNOSIS — I10 Essential (primary) hypertension: Secondary | ICD-10-CM | POA: Diagnosis not present

## 2017-03-28 DIAGNOSIS — E1165 Type 2 diabetes mellitus with hyperglycemia: Secondary | ICD-10-CM | POA: Diagnosis not present

## 2017-03-28 DIAGNOSIS — G4700 Insomnia, unspecified: Secondary | ICD-10-CM | POA: Diagnosis not present

## 2017-03-28 DIAGNOSIS — M545 Low back pain: Secondary | ICD-10-CM | POA: Diagnosis not present

## 2017-06-27 DIAGNOSIS — I10 Essential (primary) hypertension: Secondary | ICD-10-CM | POA: Diagnosis not present

## 2017-06-27 DIAGNOSIS — M545 Low back pain: Secondary | ICD-10-CM | POA: Diagnosis not present

## 2017-06-27 DIAGNOSIS — M79646 Pain in unspecified finger(s): Secondary | ICD-10-CM | POA: Diagnosis not present

## 2017-06-27 DIAGNOSIS — E1165 Type 2 diabetes mellitus with hyperglycemia: Secondary | ICD-10-CM | POA: Diagnosis not present

## 2017-07-20 ENCOUNTER — Other Ambulatory Visit: Payer: Self-pay

## 2017-07-20 ENCOUNTER — Other Ambulatory Visit: Payer: Self-pay | Admitting: Nurse Practitioner

## 2017-07-20 DIAGNOSIS — F5101 Primary insomnia: Secondary | ICD-10-CM

## 2017-07-20 MED ORDER — ZOLPIDEM TARTRATE 10 MG PO TABS: 10.0000 mg | ORAL_TABLET | Freq: Every evening | ORAL | 2 refills | Status: DC | PRN

## 2017-07-20 MED ORDER — ESOMEPRAZOLE MAGNESIUM 40 MG PO CPDR: 40.0000 mg | DELAYED_RELEASE_CAPSULE | Freq: Every day | ORAL | 1 refills | Status: DC

## 2017-07-20 NOTE — Progress Notes (Signed)
Renewed ambien 10mg  qhs prn #30 with 2 refills.

## 2017-08-17 ENCOUNTER — Ambulatory Visit (INDEPENDENT_AMBULATORY_CARE_PROVIDER_SITE_OTHER): Payer: BLUE CROSS/BLUE SHIELD | Admitting: Nurse Practitioner

## 2017-08-17 ENCOUNTER — Encounter: Payer: Self-pay | Admitting: Nurse Practitioner

## 2017-08-17 VITALS — BP 141/99 | HR 113 | Temp 99.5°F | Resp 16 | Ht 74.0 in | Wt 243.8 lb

## 2017-08-17 DIAGNOSIS — E1169 Type 2 diabetes mellitus with other specified complication: Secondary | ICD-10-CM | POA: Insufficient documentation

## 2017-08-17 DIAGNOSIS — E1159 Type 2 diabetes mellitus with other circulatory complications: Secondary | ICD-10-CM | POA: Insufficient documentation

## 2017-08-17 DIAGNOSIS — F329 Major depressive disorder, single episode, unspecified: Secondary | ICD-10-CM | POA: Insufficient documentation

## 2017-08-17 DIAGNOSIS — G4700 Insomnia, unspecified: Secondary | ICD-10-CM | POA: Diagnosis not present

## 2017-08-17 DIAGNOSIS — J069 Acute upper respiratory infection, unspecified: Secondary | ICD-10-CM

## 2017-08-17 DIAGNOSIS — E1165 Type 2 diabetes mellitus with hyperglycemia: Secondary | ICD-10-CM | POA: Diagnosis not present

## 2017-08-17 DIAGNOSIS — R945 Abnormal results of liver function studies: Secondary | ICD-10-CM | POA: Insufficient documentation

## 2017-08-17 DIAGNOSIS — E782 Mixed hyperlipidemia: Secondary | ICD-10-CM | POA: Insufficient documentation

## 2017-08-17 DIAGNOSIS — M722 Plantar fascial fibromatosis: Secondary | ICD-10-CM

## 2017-08-17 DIAGNOSIS — K219 Gastro-esophageal reflux disease without esophagitis: Secondary | ICD-10-CM | POA: Diagnosis not present

## 2017-08-17 DIAGNOSIS — I10 Essential (primary) hypertension: Secondary | ICD-10-CM

## 2017-08-17 MED ORDER — SEMAGLUTIDE(0.25 OR 0.5MG/DOS) 2 MG/1.5ML ~~LOC~~ SOPN: 0.5000 mg | PEN_INJECTOR | SUBCUTANEOUS | 3 refills | Status: DC

## 2017-08-17 MED ORDER — TRAMADOL HCL 50 MG PO TABS: ORAL_TABLET | ORAL | 1 refills | Status: DC

## 2017-08-17 MED ORDER — AMOXICILLIN-POT CLAVULANATE 875-125 MG PO TABS: 1.0000 | ORAL_TABLET | Freq: Two times a day (BID) | ORAL | 0 refills | Status: DC

## 2017-08-17 MED ORDER — ZOLPIDEM TARTRATE ER 12.5 MG PO TBCR: 12.5000 mg | EXTENDED_RELEASE_TABLET | Freq: Every evening | ORAL | 1 refills | Status: DC | PRN

## 2017-08-17 NOTE — Progress Notes (Signed)
[facility] [place], [place] [address]  Internal MEDICINE  Office Visit Note  Patient Name: Clinton Moore  [id number]  [id number]  Date of Service: 08/29/2017  Chief Complaint  Patient presents with  . Cough    head congested, headache, some body aches....Marland KitchenMarland KitchenFMLA form  . Medication Refill    Cough  This is a new problem. The current episode started in the past 7 days. The problem has been gradually worsening. The cough is productive of purulent sputum. Associated symptoms include chills, ear congestion, a fever, headaches, nasal congestion, postnasal drip, rhinorrhea and a sore throat. Pertinent negatives include no rash. Exacerbated by: swallowing makes his throat hurt worse.  He has tried OTC cough suppressant and rest for the symptoms. The treatment provided moderate relief. His past medical history is significant for environmental allergies.  Medication Refill  Associated symptoms include chills, congestion, coughing, a fever, headaches and a sore throat. Pertinent negatives include no rash.    Pt is here for routine follow up.    Current Medication: Outpatient Encounter Medications as of 08/17/2017  Medication Sig  . amLODipine (NORVASC) 5 MG tablet Take 5 mg by mouth daily.  Marland Kitchen amoxicillin-clavulanate (AUGMENTIN) 875-125 MG tablet Take 1 tablet by mouth 2 (two) times daily.  . cyclobenzaprine (FLEXERIL) 10 MG tablet Take 10 mg by mouth 3 (three) times daily as needed for spasms.  Marland Kitchen esomeprazole (NEXIUM) 40 MG capsule Take 1 capsule (40 mg total) by mouth daily at 12 noon.  . etodolac (LODINE XL) 400 MG 24 hr tablet Take 400 mg by mouth 2 (two) times daily as needed. For pain.  . fluticasone (FLONASE) 50 MCG/ACT nasal spray Place 1-2 sprays into both nostrils daily as needed. For allergies.  Marland Kitchen JANUMET XR [phone] MG TB24 Take 1 tablet by mouth daily.  . Melatonin-Pyridoxine (MELATONIN/VITAMIN B-6 EX ST) 5-1 MG TABS Take 1 tablet by mouth at bedtime  as needed. For sleep.  . Multiple Vitamin (MULTIVITAMIN WITH MINERALS) TABS tablet Take 1 tablet by mouth daily. men multivitamin  . omeprazole (PRILOSEC) 40 MG capsule Take 40 mg by mouth daily.  Marland Kitchen oxyCODONE-acetaminophen (ROXICET) 5-325 MG tablet Take 1-2 tablets by mouth every 4 (four) hours as needed for severe pain.  Marland Kitchen quinapril (ACCUPRIL) 10 MG tablet Take 10 mg by mouth daily.  . Semaglutide (OZEMPIC) 0.25 or 0.5 MG/DOSE SOPN Inject 0.5 mg into the skin once a week.  . traMADol (ULTRAM) 50 MG tablet Take 1 tablet po BID prn pain  . zolpidem (AMBIEN CR) 12.5 MG CR tablet Take 1 tablet (12.5 mg total) by mouth at bedtime as needed for sleep.  . [DISCONTINUED] traMADol (ULTRAM) 50 MG tablet Take 50 mg by mouth 2 (two) times daily as needed. For pain.  . [DISCONTINUED] TRULICITY 0.53 ZJ/6.7HA SOPN Inject 0.75 mg into the skin every Monday.   . [DISCONTINUED] zolpidem (AMBIEN) 10 MG tablet Take 1 tablet (10 mg total) by mouth at bedtime as needed for sleep.   No facility-administered encounter medications on file as of 08/17/2017.     Surgical History: Past Surgical History:  Procedure Laterality Date  . FOOT MASS EXCISION Right 2007  . PLANTAR FASCIA RELEASE Left 06/09/2016   Procedure: Radical excision plantar fibromas of left plantar arch and left hallux;  Surgeon: Sharlotte Alamo, DPM;  Location: [facility] ORS;  Service: Podiatry;  Laterality: Left;    Medical History: Past Medical History:  Diagnosis Date  . Anxiety   . Depression   .  Diabetes mellitus without complication ([place])   . GERD (gastroesophageal reflux disease)   . Headache    history of migraines  . Hypertension     Family History: History reviewed. No pertinent family history.  Social History   Socioeconomic History  . Marital status: Married    Spouse name: Not on file  . Number of children: Not on file  . Years of education: Not on file  . Highest education level: Not on file  Social Needs  . Financial  resource strain: Not on file  . Food insecurity - worry: Not on file  . Food insecurity - inability: Not on file  . Transportation needs - medical: Not on file  . Transportation needs - non-medical: Not on file  Occupational History  . Not on file  Tobacco Use  . Smoking status: Light Tobacco Smoker    Types: Cigarettes  . Smokeless tobacco: Never Used  . Tobacco comment: a few cigarettes per week  Substance and Sexual Activity  . Alcohol use: Yes    Alcohol/week: 1.8 oz    Types: 3 Cans of beer per week  . Drug use: No  . Sexual activity: No  Other Topics Concern  . Not on file  Social History Narrative  . Not on file      Review of Systems  Constitutional: Positive for activity change, chills and fever.  HENT: Positive for congestion, postnasal drip, rhinorrhea, sinus pressure and sore throat.   Respiratory: Positive for cough.   Endocrine:       Blood sugars elevated.   Musculoskeletal:       Chronic knee and low back pain.   Skin: Negative for color change, pallor, rash and wound.  Allergic/Immunologic: Positive for environmental allergies.  Neurological: Positive for headaches.  Psychiatric/Behavioral: Positive for sleep disturbance.    Today's Vitals   08/17/17 1546  BP: (!) 141/99  Pulse: (!) 113  Resp: 16  Temp: 99.5 F (37.5 C)  SpO2: 98%  Weight: 243 lb 12.8 oz (110.6 kg)  Height: 6\' 2"  (1.88 m)    Physical Exam  Constitutional: He is oriented to person, place, and time. He appears well-developed and well-nourished.  HENT:  Head: Normocephalic and atraumatic.  Right Ear: Tympanic membrane is erythematous and bulging.  Left Ear: Tympanic membrane is erythematous and bulging.  Nose: Rhinorrhea present. Right sinus exhibits maxillary sinus tenderness and frontal sinus tenderness. Left sinus exhibits maxillary sinus tenderness and frontal sinus tenderness.  Mouth/Throat: Posterior oropharyngeal edema present.  Post nasal drip evident.   Eyes: Pupils  are equal, round, and reactive to light.  Neck: Normal range of motion. Neck supple.  Cardiovascular: Normal rate and regular rhythm.  Mild tachycardia present.   Pulmonary/Chest: Effort normal. He has wheezes.  Congested, non-productive cough present.   Abdominal: Soft. Bowel sounds are normal. There is no tenderness.  Musculoskeletal: Normal range of motion.  Lymphadenopathy:    He has cervical adenopathy.  Neurological: He is alert and oriented to person, place, and time.  Skin: Skin is warm and dry.  Psychiatric: He has a normal mood and affect. His behavior is normal. Judgment and thought content normal.   Assessment/Plan:  1. Acute upper respiratory infection - amoxicillin-clavulanate (AUGMENTIN) 875-125 MG tablet; Take 1 tablet by mouth 2 (two) times daily.  Dispense: 20 tablet; Refill: 0 Recommended rest and increaed fluid intake. OTC medication to help with symptoms.   2. Type 2 diabetes mellitus with hyperglycemia, unspecified whether long term insulin use (  [place]) - Semaglutide (OZEMPIC) 0.25 or 0.5 MG/DOSE SOPN; Inject 0.5 mg into the skin once a week.  Dispense: 4 pen; Refill: 3 Continue other diabetic medications as prescribed..  3. Essential (primary) hypertension Generally stable. Continue bp medication as prescribed.   4. Gastroesophageal reflux disease without esophagitis [place]. Continue omeprazole as prescribed.   5. Plantar fascial fibromatosis - traMADol (ULTRAM) 50 MG tablet; Take 1 tablet po BID prn pain  Dispense: 180 tablet; Refill: 1 FMLA paperwork received and will complete and return to patient when ready.   6. Insomnia, unspecified type - zolpidem (AMBIEN CR) 12.5 MG CR tablet; Take 1 tablet (12.5 mg total) by mouth at bedtime as needed for sleep.  Dispense: 90 tablet; Refill: 1   General Counseling: Renata Caprice understanding of the findings of todays visit and agrees with plan of treatment. I have discussed any further diagnostic evaluation  that may be needed or ordered today. We also reviewed his medications today. he has been encouraged to call the office with any questions or concerns that should arise related to todays visit.   This patient was seen by Leretha Pol, FNP- C in Collaboration with Dr Lavera Guise as a part of collaborative care agreement    Meds ordered this encounter  Medications  . zolpidem (AMBIEN CR) 12.5 MG CR tablet    Sig: Take 1 tablet (12.5 mg total) by mouth at bedtime as needed for sleep.    Dispense:  90 tablet    Refill:  1    Order Specific Question:   Supervising Provider    Answer:   Lavera Guise [[id number]]  . Semaglutide (OZEMPIC) 0.25 or 0.5 MG/DOSE SOPN    Sig: Inject 0.5 mg into the skin once a week.    Dispense:  4 pen    Refill:  3    Patient given sample and savings card    Order Specific Question:   Supervising Provider    Answer:   Lavera Guise [[id number]]  . traMADol (ULTRAM) 50 MG tablet    Sig: Take 1 tablet po BID prn pain    Dispense:  180 tablet    Refill:  1    Order Specific Question:   Supervising Provider    Answer:   Lavera Guise [[id number]]  . amoxicillin-clavulanate (AUGMENTIN) 875-125 MG tablet    Sig: Take 1 tablet by mouth 2 (two) times daily.    Dispense:  20 tablet    Refill:  0    Order Specific Question:   Supervising Provider    Answer:   Lavera Guise [[id number]]    Time spent: 69 Minutes     Dr Lavera Guise Internal medicine

## 2017-09-27 ENCOUNTER — Ambulatory Visit (INDEPENDENT_AMBULATORY_CARE_PROVIDER_SITE_OTHER): Payer: BLUE CROSS/BLUE SHIELD | Admitting: Nurse Practitioner

## 2017-09-27 ENCOUNTER — Encounter: Payer: Self-pay | Admitting: Nurse Practitioner

## 2017-09-27 VITALS — BP 130/90 | HR 95 | Resp 16 | Ht 75.0 in | Wt 239.0 lb

## 2017-09-27 DIAGNOSIS — I10 Essential (primary) hypertension: Secondary | ICD-10-CM

## 2017-09-27 DIAGNOSIS — K219 Gastro-esophageal reflux disease without esophagitis: Secondary | ICD-10-CM

## 2017-09-27 DIAGNOSIS — F5101 Primary insomnia: Secondary | ICD-10-CM | POA: Diagnosis not present

## 2017-09-27 DIAGNOSIS — E1165 Type 2 diabetes mellitus with hyperglycemia: Secondary | ICD-10-CM | POA: Diagnosis not present

## 2017-09-27 MED ORDER — SEMAGLUTIDE (1 MG/DOSE) 2 MG/1.5ML ~~LOC~~ SOPN: 1.0000 mg | PEN_INJECTOR | SUBCUTANEOUS | 3 refills | Status: DC

## 2017-09-27 MED ORDER — GLIMEPIRIDE 2 MG PO TABS: 2.0000 mg | ORAL_TABLET | Freq: Every day | ORAL | 3 refills | Status: DC

## 2017-09-27 NOTE — Progress Notes (Signed)
[facility] [place],  [address]  Internal MEDICINE  Office Visit Note  Patient Name: Clinton Moore  [id number]  [id number]  Date of Service: 10/14/2017  Chief Complaint  Patient presents with  . Diabetes    Diabetes  He presents for his follow-up diabetic visit. He has type 2 diabetes mellitus. His disease course has been worsening. There are no hypoglycemic associated symptoms. Pertinent negatives for hypoglycemia include no headaches or seizures. Associated symptoms include polyphagia, polyuria and weight loss. Pertinent negatives for diabetes include no chest pain and no fatigue. There are no hypoglycemic complications. Symptoms are stable. There are no diabetic complications. Risk factors for coronary artery disease include dyslipidemia, hypertension and male sex. Current diabetic treatment includes oral agent (dual therapy). He is compliant with treatment most of the time. His weight is stable. He is following a generally healthy diet. Meal planning includes avoidance of concentrated sweets. He participates in exercise every other day. There is no change in his home blood glucose trend. An ACE inhibitor/angiotensin II receptor blocker is being taken. He sees a podiatrist.Eye exam is not current.    Pt is here for routine follow up.    Current Medication: Outpatient Encounter Medications as of 09/27/2017  Medication Sig  . amLODipine (NORVASC) 5 MG tablet Take 5 mg by mouth daily.  . cyclobenzaprine (FLEXERIL) 10 MG tablet Take 10 mg by mouth 3 (three) times daily as needed for spasms.  Marland Kitchen esomeprazole (NEXIUM) 40 MG capsule Take 1 capsule (40 mg total) by mouth daily at 12 noon.  . etodolac (LODINE XL) 400 MG 24 hr tablet Take 400 mg by mouth 2 (two) times daily as needed. For pain.  . fluticasone (FLONASE) 50 MCG/ACT nasal spray Place 1-2 sprays into both nostrils daily as needed. For allergies.  Marland Kitchen glimepiride (AMARYL) 2 MG tablet Take 1 tablet  (2 mg total) by mouth daily before breakfast.  . Melatonin-Pyridoxine (MELATONIN/VITAMIN B-6 EX ST) 5-1 MG TABS Take 1 tablet by mouth at bedtime as needed. For sleep.  . Multiple Vitamin (MULTIVITAMIN WITH MINERALS) TABS tablet Take 1 tablet by mouth daily. men multivitamin  . omeprazole (PRILOSEC) 40 MG capsule Take 40 mg by mouth daily.  Marland Kitchen oxyCODONE-acetaminophen (ROXICET) 5-325 MG tablet Take 1-2 tablets by mouth every 4 (four) hours as needed for severe pain.  Marland Kitchen quinapril (ACCUPRIL) 10 MG tablet Take 10 mg by mouth daily.  . Semaglutide (OZEMPIC) 1 MG/DOSE SOPN Inject 1 mg into the skin once a week.  . traMADol (ULTRAM) 50 MG tablet Take 1 tablet po BID prn pain  . zolpidem (AMBIEN CR) 12.5 MG CR tablet Take 1 tablet (12.5 mg total) by mouth at bedtime as needed for sleep.  . [DISCONTINUED] amoxicillin-clavulanate (AUGMENTIN) 875-125 MG tablet Take 1 tablet by mouth 2 (two) times daily.  . [DISCONTINUED] JANUMET XR [phone] MG TB24 Take 1 tablet by mouth daily.  . [DISCONTINUED] Semaglutide (OZEMPIC) 0.25 or 0.5 MG/DOSE SOPN Inject 0.5 mg into the skin once a week.   No facility-administered encounter medications on file as of 09/27/2017.     Surgical History: Past Surgical History:  Procedure Laterality Date  . FOOT MASS EXCISION Right 2007  . PLANTAR FASCIA RELEASE Left 06/09/2016   Procedure: Radical excision plantar fibromas of left plantar arch and left hallux;  Surgeon: Sharlotte Alamo, DPM;  Location: [facility] ORS;  Service: Podiatry;  Laterality: Left;    Medical History: Past Medical History:  Diagnosis Date  . Anxiety   .  Depression   . Diabetes mellitus without complication ([place])   . GERD (gastroesophageal reflux disease)   . Headache    history of migraines  . Hypertension     Family History: History reviewed. No pertinent family history.  Social History   Socioeconomic History  . Marital status: Married    Spouse name: Not on file  . Number of children: Not on file   . Years of education: Not on file  . Highest education level: Not on file  Social Needs  . Financial resource strain: Not on file  . Food insecurity - worry: Not on file  . Food insecurity - inability: Not on file  . Transportation needs - medical: Not on file  . Transportation needs - non-medical: Not on file  Occupational History  . Not on file  Tobacco Use  . Smoking status: Light Tobacco Smoker    Types: Cigarettes  . Smokeless tobacco: Never Used  . Tobacco comment: a few cigarettes per week  Substance and Sexual Activity  . Alcohol use: Yes    Alcohol/week: 1.8 oz    Types: 3 Cans of beer per week  . Drug use: No  . Sexual activity: No  Other Topics Concern  . Not on file  Social History Narrative  . Not on file      Review of Systems  Constitutional: Positive for weight loss. Negative for activity change and fatigue.  HENT: Negative for congestion, postnasal drip, rhinorrhea, sinus pressure and sore throat.   Eyes: Negative.   Respiratory: Negative for chest tightness, shortness of breath and wheezing.   Cardiovascular: Negative for chest pain and palpitations.  Gastrointestinal: Negative for constipation, diarrhea, nausea and vomiting.  Endocrine: Positive for polyphagia and polyuria.       Blood sugars are running very high, recentlly.   Genitourinary: Negative.   Musculoskeletal: Positive for arthralgias.  Neurological: Negative for seizures and headaches.  Psychiatric/Behavioral: Positive for sleep disturbance.    Today's Vitals   09/27/17 1624  BP: 130/90  Pulse: 95  Resp: 16  SpO2: 96%  Weight: 239 lb (108.4 kg)  Height: 6\' 3"  (1.905 m)    Physical Exam  Constitutional: He is oriented to person, place, and time. He appears well-developed and well-nourished. No distress.  HENT:  Head: Normocephalic and atraumatic.  Mouth/Throat: Oropharynx is clear and moist. No oropharyngeal exudate.  Eyes: EOM are normal. Pupils are equal, round, and reactive  to light.  Neck: Normal range of motion. Neck supple. No JVD present. No tracheal deviation present. No thyromegaly present.  Cardiovascular: Normal rate, regular rhythm and normal heart sounds. Exam reveals no gallop and no friction rub.  No murmur heard. Pulmonary/Chest: Effort normal and breath sounds normal. No respiratory distress. He has no wheezes. He has no rales. He exhibits no tenderness.  Abdominal: Soft. Bowel sounds are normal. There is no tenderness.  Musculoskeletal: Normal range of motion.  Lymphadenopathy:    He has no cervical adenopathy.  Neurological: He is alert and oriented to person, place, and time. No cranial nerve deficit.  Skin: Skin is warm and dry. He is not diaphoretic.  Psychiatric: He has a normal mood and affect. His behavior is normal. Judgment and thought content normal.  Nursing note and vitals reviewed.  Assessment/Plan:  1. Uncontrolled type 2 diabetes mellitus with hyperglycemia (HCC) - POCT HgB A1C 9.7 today. Increase ozempic to 0.5mg  once weekly. Reviewed importance of following ADA diet and participation in regular physical activity.  - Semaglutide (  OZEMPIC) 1 MG/DOSE SOPN; Inject 1 mg into the skin once a week.  Dispense: 4 pen; Refill: 3 - glimepiride (AMARYL) 2 MG tablet; Take 1 tablet (2 mg total) by mouth daily before breakfast.  Dispense: 30 tablet; Refill: 3  2. Essential (primary) hypertension Stable. Continue bp medication as prescribed  3. Gastroesophageal reflux disease without esophagitis Continue omeprazole as prescribed.   4. Primary insomnia May continue to use ambien as needed and as prrescribed.   General Counseling: tres grzywacz understanding of the findings of todays visit and agrees with plan of treatment. I have discussed any further diagnostic evaluation that may be needed or ordered today. We also reviewed his medications today. he has been encouraged to call the office with any questions or concerns that should  arise related to todays visit.  Diabetes Counseling:  1. Addition of ACE inh/ ARB'S for nephroprotection. 2. Diabetic foot care, prevention of complications.  3.Exercise and lose weight.  4. Diabetic eye examination, 5. Monitor blood sugar closlely. nutrition counseling.  6.Sign and symptoms of hypoglycemia including shaking sweating,confusion and headaches. . This patient was seen by Leretha Pol, FNP- C in Collaboration with Dr Lavera Guise as a part of collaborative care agreement      Orders Placed This Encounter  Procedures  . POCT HgB A1C    Meds ordered this encounter  Medications  . Semaglutide (OZEMPIC) 1 MG/DOSE SOPN    Sig: Inject 1 mg into the skin once a week.    Dispense:  4 pen    Refill:  3    Increased dose    Order Specific Question:   Supervising Provider    Answer:   Lavera Guise [[id number]]  . glimepiride (AMARYL) 2 MG tablet    Sig: Take 1 tablet (2 mg total) by mouth daily before breakfast.    Dispense:  30 tablet    Refill:  3    Order Specific Question:   Supervising Provider    Answer:   Lavera Guise [[id number]]    Time spent: 40 Minutes          Dr Lavera Guise Internal medicine

## 2017-10-14 DIAGNOSIS — E1165 Type 2 diabetes mellitus with hyperglycemia: Secondary | ICD-10-CM | POA: Insufficient documentation

## 2017-10-15 LAB — POCT GLYCOSYLATED HEMOGLOBIN (HGB A1C): HEMOGLOBIN A1C: 9.7

## 2018-03-19 ENCOUNTER — Other Ambulatory Visit: Payer: Self-pay

## 2018-03-19 MED ORDER — QUINAPRIL HCL 10 MG PO TABS: 10.0000 mg | ORAL_TABLET | Freq: Every day | ORAL | 4 refills | Status: DC

## 2018-03-19 MED ORDER — AMLODIPINE BESYLATE 5 MG PO TABS: 5.0000 mg | ORAL_TABLET | Freq: Every day | ORAL | 4 refills | Status: DC

## 2018-05-10 ENCOUNTER — Other Ambulatory Visit: Payer: Self-pay | Admitting: Internal Medicine

## 2018-05-10 DIAGNOSIS — E1165 Type 2 diabetes mellitus with hyperglycemia: Secondary | ICD-10-CM

## 2018-05-17 ENCOUNTER — Other Ambulatory Visit: Payer: Self-pay

## 2018-05-17 DIAGNOSIS — E1165 Type 2 diabetes mellitus with hyperglycemia: Secondary | ICD-10-CM

## 2018-05-17 MED ORDER — GLIMEPIRIDE 2 MG PO TABS: ORAL_TABLET | ORAL | 0 refills | Status: DC

## 2018-05-21 ENCOUNTER — Ambulatory Visit (INDEPENDENT_AMBULATORY_CARE_PROVIDER_SITE_OTHER): Payer: BLUE CROSS/BLUE SHIELD | Admitting: Adult Health

## 2018-05-21 ENCOUNTER — Encounter: Payer: Self-pay | Admitting: Adult Health

## 2018-05-21 VITALS — BP 135/104 | HR 103 | Resp 16 | Ht 75.0 in | Wt 233.0 lb

## 2018-05-21 DIAGNOSIS — I10 Essential (primary) hypertension: Secondary | ICD-10-CM | POA: Diagnosis not present

## 2018-05-21 DIAGNOSIS — G8929 Other chronic pain: Secondary | ICD-10-CM

## 2018-05-21 DIAGNOSIS — G4700 Insomnia, unspecified: Secondary | ICD-10-CM

## 2018-05-21 DIAGNOSIS — E1165 Type 2 diabetes mellitus with hyperglycemia: Secondary | ICD-10-CM | POA: Diagnosis not present

## 2018-05-21 DIAGNOSIS — E782 Mixed hyperlipidemia: Secondary | ICD-10-CM | POA: Diagnosis not present

## 2018-05-21 DIAGNOSIS — M549 Dorsalgia, unspecified: Secondary | ICD-10-CM | POA: Diagnosis not present

## 2018-05-21 LAB — POCT GLYCOSYLATED HEMOGLOBIN (HGB A1C): HEMOGLOBIN A1C: 13.7 % — AB (ref 4.0–5.6)

## 2018-05-21 MED ORDER — ZOLPIDEM TARTRATE 10 MG PO TABS: 10.0000 mg | ORAL_TABLET | Freq: Every evening | ORAL | 1 refills | Status: DC | PRN

## 2018-05-21 MED ORDER — ESOMEPRAZOLE MAGNESIUM 40 MG PO CPDR: 40.0000 mg | DELAYED_RELEASE_CAPSULE | Freq: Every day | ORAL | 1 refills | Status: DC

## 2018-05-21 MED ORDER — OXYCODONE-ACETAMINOPHEN 5-325 MG PO TABS: 1.0000 | ORAL_TABLET | ORAL | 0 refills | Status: DC | PRN

## 2018-05-21 MED ORDER — DULAGLUTIDE 1.5 MG/0.5ML ~~LOC~~ SOAJ
1.5000 mg | SUBCUTANEOUS | 1 refills | Status: DC
Start: 2018-05-21 — End: 2018-11-13

## 2018-05-21 NOTE — Patient Instructions (Signed)
Diabetes Mellitus and Nutrition When you have diabetes (diabetes mellitus), it is very important to have healthy eating habits because your blood sugar (glucose) levels are greatly affected by what you eat and drink. Eating healthy foods in the appropriate amounts, at about the same times every day, can help you:  Control your blood glucose.  Lower your risk of heart disease.  Improve your blood pressure.  Reach or maintain a healthy weight.  Every person with diabetes is different, and each person has different needs for a meal plan. Your health care provider may recommend that you work with a diet and nutrition specialist (dietitian) to make a meal plan that is best for you. Your meal plan may vary depending on factors such as:  The calories you need.  The medicines you take.  Your weight.  Your blood glucose, blood pressure, and cholesterol levels.  Your activity level.  Other health conditions you have, such as heart or kidney disease.  How do carbohydrates affect me? Carbohydrates affect your blood glucose level more than any other type of food. Eating carbohydrates naturally increases the amount of glucose in your blood. Carbohydrate counting is a method for keeping track of how many carbohydrates you eat. Counting carbohydrates is important to keep your blood glucose at a healthy level, especially if you use insulin or take certain oral diabetes medicines. It is important to know how many carbohydrates you can safely have in each meal. This is different for every person. Your dietitian can help you calculate how many carbohydrates you should have at each meal and for snack. Foods that contain carbohydrates include:  Bread, cereal, rice, pasta, and crackers.  Potatoes and corn.  Peas, beans, and lentils.  Milk and yogurt.  Fruit and juice.  Desserts, such as cakes, cookies, ice cream, and candy.  How does alcohol affect me? Alcohol can cause a sudden decrease in blood  glucose (hypoglycemia), especially if you use insulin or take certain oral diabetes medicines. Hypoglycemia can be a life-threatening condition. Symptoms of hypoglycemia (sleepiness, dizziness, and confusion) are similar to symptoms of having too much alcohol. If your health care provider says that alcohol is safe for you, follow these guidelines:  Limit alcohol intake to no more than 1 drink per day for nonpregnant women and 2 drinks per day for men. One drink equals 12 oz of beer, 5 oz of wine, or 1 oz of hard liquor.  Do not drink on an empty stomach.  Keep yourself hydrated with water, diet soda, or unsweetened iced tea.  Keep in mind that regular soda, juice, and other mixers may contain a lot of sugar and must be counted as carbohydrates.  What are tips for following this plan? Reading food labels  Start by checking the serving size on the label. The amount of calories, carbohydrates, fats, and other nutrients listed on the label are based on one serving of the food. Many foods contain more than one serving per package.  Check the total grams (g) of carbohydrates in one serving. You can calculate the number of servings of carbohydrates in one serving by dividing the total carbohydrates by 15. For example, if a food has 30 g of total carbohydrates, it would be equal to 2 servings of carbohydrates.  Check the number of grams (g) of saturated and trans fats in one serving. Choose foods that have low or no amount of these fats.  Check the number of milligrams (mg) of sodium in one serving. Most people   should limit total sodium intake to less than 2,300 mg per day.  Always check the nutrition information of foods labeled as "low-fat" or "nonfat". These foods may be higher in added sugar or refined carbohydrates and should be avoided.  Talk to your dietitian to identify your daily goals for nutrients listed on the label. Shopping  Avoid buying canned, premade, or processed foods. These  foods tend to be high in fat, sodium, and added sugar.  Shop around the outside edge of the grocery store. This includes fresh fruits and vegetables, bulk grains, fresh meats, and fresh dairy. Cooking  Use low-heat cooking methods, such as baking, instead of high-heat cooking methods like deep frying.  Cook using healthy oils, such as olive, canola, or sunflower oil.  Avoid cooking with butter, cream, or high-fat meats. Meal planning  Eat meals and snacks regularly, preferably at the same times every day. Avoid going long periods of time without eating.  Eat foods high in fiber, such as fresh fruits, vegetables, beans, and whole grains. Talk to your dietitian about how many servings of carbohydrates you can eat at each meal.  Eat 4-6 ounces of lean protein each day, such as lean meat, chicken, fish, eggs, or tofu. 1 ounce is equal to 1 ounce of meat, chicken, or fish, 1 egg, or 1/4 cup of tofu.  Eat some foods each day that contain healthy fats, such as avocado, nuts, seeds, and fish. Lifestyle   Check your blood glucose regularly.  Exercise at least 30 minutes 5 or more days each week, or as told by your health care provider.  Take medicines as told by your health care provider.  Do not use any products that contain nicotine or tobacco, such as cigarettes and e-cigarettes. If you need help quitting, ask your health care provider.  Work with a counselor or diabetes educator to identify strategies to manage stress and any emotional and social challenges. What are some questions to ask my health care provider?  Do I need to meet with a diabetes educator?  Do I need to meet with a dietitian?  What number can I call if I have questions?  When are the best times to check my blood glucose? Where to find more information:  American Diabetes Association: diabetes.org/food-and-fitness/food  Academy of Nutrition and Dietetics:  www.eatright.org/resources/health/diseases-and-conditions/diabetes  National Institute of Diabetes and Digestive and Kidney Diseases (NIH): www.niddk.nih.gov/health-information/diabetes/overview/diet-eating-physical-activity Summary  A healthy meal plan will help you control your blood glucose and maintain a healthy lifestyle.  Working with a diet and nutrition specialist (dietitian) can help you make a meal plan that is best for you.  Keep in mind that carbohydrates and alcohol have immediate effects on your blood glucose levels. It is important to count carbohydrates and to use alcohol carefully. This information is not intended to replace advice given to you by your health care provider. Make sure you discuss any questions you have with your health care provider. Document Released: 04/13/2005 Document Revised: 08/21/2016 Document Reviewed: 08/21/2016 Elsevier Interactive Patient Education  2018 Elsevier Inc.  

## 2018-05-21 NOTE — Progress Notes (Signed)
[facility] [address], [place] [address]  Internal MEDICINE  Office Visit Note  Patient Name: Clinton Moore  [id number]  [id number]  Date of Service: 05/22/2018  Chief Complaint  Patient presents with  . Hyperlipidemia  . Hypertension  . Diabetes  . Quality Metric Gaps    need physical ,    HPI Patient presents to office today for follow-up on hyperlipidemia, hypertension, diabetes, and his FMLA paperwork needs to be completed.  Patient has been over a year without being seen in this office.  His blood pressure today is elevated 135/104.  He reports he has not taken his blood pressure medicine yesterday or today.  Patient believes his diabetes is uncontrolled at this time due to the fact that he has not taken his medication in quite some time.  His previous A1c was done in March of this year and it was found to be 9.7.  Today it is 13.7 in the office.  He does not have a recent lipid panel,.  Labs will be ordered at this visit.  Patient is also due for yearly physical, and he is agreeable to scheduling this at this time.    Current Medication: Outpatient Encounter Medications as of 05/21/2018  Medication Sig  . amLODipine (NORVASC) 5 MG tablet Take 1 tablet (5 mg total) by mouth daily.  . cyclobenzaprine (FLEXERIL) 10 MG tablet Take 10 mg by mouth 3 (three) times daily as needed for spasms.  Marland Kitchen esomeprazole (NEXIUM) 40 MG capsule Take 1 capsule (40 mg total) by mouth daily at 12 noon.  . fluticasone (FLONASE) 50 MCG/ACT nasal spray Place 1-2 sprays into both nostrils daily as needed. For allergies.  Marland Kitchen glimepiride (AMARYL) 2 MG tablet TAKE 1 TABLET WITH LARGEST MEAL OF THE DAY  . Melatonin-Pyridoxine (MELATONIN/VITAMIN B-6 EX ST) 5-1 MG TABS Take 1 tablet by mouth at bedtime as needed. For sleep.  . meloxicam (MOBIC) 15 MG tablet TAKE 1 TABLET DAILY AS NEEDED FOR PAIN  . Multiple Vitamin (MULTIVITAMIN WITH MINERALS) TABS tablet Take 1 tablet by mouth  daily. men multivitamin  . oxyCODONE-acetaminophen (ROXICET) 5-325 MG tablet Take 1-2 tablets by mouth every 4 (four) hours as needed for severe pain.  . traMADol (ULTRAM) 50 MG tablet Take 1 tablet po BID prn pain  . [DISCONTINUED] esomeprazole (NEXIUM) 40 MG capsule Take 1 capsule (40 mg total) by mouth daily at 12 noon.  . [DISCONTINUED] omeprazole (PRILOSEC) 40 MG capsule Take 40 mg by mouth daily.  . [DISCONTINUED] oxyCODONE-acetaminophen (ROXICET) 5-325 MG tablet Take 1-2 tablets by mouth every 4 (four) hours as needed for severe pain.  . [DISCONTINUED] oxyCODONE-acetaminophen (ROXICET) 5-325 MG tablet Take 1-2 tablets by mouth every 4 (four) hours as needed for severe pain.  . [DISCONTINUED] Semaglutide (OZEMPIC) 1 MG/DOSE SOPN Inject 1 mg into the skin once a week.  . [DISCONTINUED] zolpidem (AMBIEN CR) 12.5 MG CR tablet Take 1 tablet (12.5 mg total) by mouth at bedtime as needed for sleep.  . Dulaglutide (TRULICITY) 1.5 MV/7.8IO SOPN Inject 1.5 mg into the skin once a week.  . quinapril (ACCUPRIL) 10 MG tablet Take 1 tablet (10 mg total) by mouth daily.  Marland Kitchen zolpidem (AMBIEN) 10 MG tablet Take 1 tablet (10 mg total) by mouth at bedtime as needed for sleep.  . [DISCONTINUED] etodolac (LODINE XL) 400 MG 24 hr tablet Take 400 mg by mouth 2 (two) times daily as needed. For pain.   No facility-administered encounter medications on file  as of 05/21/2018.     Surgical History: Past Surgical History:  Procedure Laterality Date  . FOOT MASS EXCISION Right 2007  . PLANTAR FASCIA RELEASE Left 06/09/2016   Procedure: Radical excision plantar fibromas of left plantar arch and left hallux;  Surgeon: Sharlotte Alamo, DPM;  Location: [facility] ORS;  Service: Podiatry;  Laterality: Left;    Medical History: Past Medical History:  Diagnosis Date  . Anxiety   . Depression   . Diabetes mellitus without complication ([place])   . GERD (gastroesophageal reflux disease)   . Headache    history of migraines  .  Hypertension     Family History: Family History  Problem Relation Age of Onset  . Diabetes Father   . Hypertension Father     Social History   Socioeconomic History  . Marital status: Married    Spouse name: Not on file  . Number of children: Not on file  . Years of education: Not on file  . Highest education level: Not on file  Occupational History  . Not on file  Social Needs  . Financial resource strain: Not on file  . Food insecurity:    Worry: Not on file    Inability: Not on file  . Transportation needs:    Medical: Not on file    Non-medical: Not on file  Tobacco Use  . Smoking status: Former Smoker    Types: Cigarettes  . Smokeless tobacco: Never Used  . Tobacco comment: a few cigarettes per week  Substance and Sexual Activity  . Alcohol use: Yes    Alcohol/week: 3.0 standard drinks    Types: 3 Cans of beer per week  . Drug use: No  . Sexual activity: Never  Lifestyle  . Physical activity:    Days per week: Not on file    Minutes per session: Not on file  . Stress: Not on file  Relationships  . Social connections:    Talks on phone: Not on file    Gets together: Not on file    Attends religious service: Not on file    Active member of club or organization: Not on file    Attends meetings of clubs or organizations: Not on file    Relationship status: Not on file  . Intimate partner violence:    Fear of current or ex partner: Not on file    Emotionally abused: Not on file    Physically abused: Not on file    Forced sexual activity: Not on file  Other Topics Concern  . Not on file  Social History Narrative  . Not on file      Review of Systems  Constitutional: Negative.  Negative for chills, fatigue and unexpected weight change.  HENT: Negative.  Negative for congestion, rhinorrhea, sneezing and sore throat.   Eyes: Negative for redness.  Respiratory: Negative.  Negative for cough, chest tightness and shortness of breath.   Cardiovascular:  Negative.  Negative for chest pain and palpitations.  Gastrointestinal: Negative.  Negative for abdominal pain, constipation, diarrhea, nausea and vomiting.  Endocrine: Negative.   Genitourinary: Negative.  Negative for dysuria and frequency.  Musculoskeletal: Negative.  Negative for arthralgias, back pain, joint swelling and neck pain.  Skin: Negative.  Negative for rash.  Allergic/Immunologic: Negative.   Neurological: Negative.  Negative for tremors and numbness.  Hematological: Negative for adenopathy. Does not bruise/bleed easily.  Psychiatric/Behavioral: Negative.  Negative for behavioral problems, sleep disturbance and suicidal ideas. The patient is not  nervous/anxious.     Vital Signs: BP (!) 135/104   Pulse (!) 103   Resp 16   Ht 6\' 3"  (1.905 m)   Wt 233 lb (105.7 kg)   SpO2 96%   BMI 29.12 kg/m    Physical Exam  Constitutional: He is oriented to person, place, and time. He appears well-developed and well-nourished. No distress.  HENT:  Head: Normocephalic and atraumatic.  Mouth/Throat: Oropharynx is clear and moist. No oropharyngeal exudate.  Eyes: Pupils are equal, round, and reactive to light. EOM are normal.  Neck: Normal range of motion. Neck supple. No JVD present. No tracheal deviation present. No thyromegaly present.  Cardiovascular: Normal rate, regular rhythm and normal heart sounds. Exam reveals no gallop and no friction rub.  No murmur heard. Pulmonary/Chest: Effort normal and breath sounds normal. No respiratory distress. He has no wheezes. He has no rales. He exhibits no tenderness.  Abdominal: Soft. There is no tenderness. There is no guarding.  Musculoskeletal: Normal range of motion.  Lymphadenopathy:    He has no cervical adenopathy.  Neurological: He is alert and oriented to person, place, and time. No cranial nerve deficit.  Skin: Skin is warm and dry. He is not diaphoretic.  Psychiatric: He has a normal mood and affect. His behavior is normal.  Judgment and thought content normal.  Nursing note and vitals reviewed.  Assessment/Plan: 1. Uncontrolled type 2 diabetes mellitus with hyperglycemia ([place]) Refilled patient's Trulicity at this time.  We had a frank discussion about compliance with diabetes medications.  We discussed endorgan damage from uncontrolled blood sugars.  I explained that I could not help him if he would not help me by doing what I asked and showing up to appointments.  He verbalized understanding and states he is committed to getting back on track with help at this time. - POCT HgB A1C - Dulaglutide (TRULICITY) 1.5 XB/1.4NW SOPN; Inject 1.5 mg into the skin once a week.  Dispense: 12 pen; Refill: 1  2. Essential (primary) hypertension Patient's blood pressure elevated today at visit.  Has not had blood pressure medicine for the last 2 days.  We discussed compliance with blood pressure medications as well as complications of uncontrolled blood pressure.  He verbalized understanding and is going to take his medications.  3. Mixed hyperlipidemia No recent lipid panel on patient.  Labs ordered today will evaluate at next visit when results are available.  4. Chronic back pain greater than 3 months duration Patient has chronic back pain that has been going on for multiple years.  He has intermittent flareups for which he needs rest, and anti-inflammatory medications.  He has not physical therapy in the past, and reports no change in the pain. I renewed his Percocet prescription for 30 tablets.  The last 30 tablets were given to him in 2017 and he reports he has just taken the last one a few weeks ago when his back flared up.  I explained to him that I would fill this medication at this time however if this became a chronic need he would need to see pain management. - oxyCODONE-acetaminophen (ROXICET) 5-325 MG tablet; Take 1-2 tablets by mouth every 4 (four) hours as needed for severe pain.  Dispense: 30 tablet; Refill: 0  5.  Insomnia, unspecified type Patient is out of his Ambien, sent refill to pharmacy at this time.  We discussed safety while taking Ambien.  He verbalized he understood he should not be driving or operating heavy machinery within  8 to 10 hours of taking an Ambien and also knows not to mix them with alcohol. - zolpidem (AMBIEN) 10 MG tablet; Take 1 tablet (10 mg total) by mouth at bedtime as needed for sleep.  Dispense: 90 tablet; Refill: 1  General Counseling: Renata Caprice understanding of the findings of todays visit and agrees with plan of treatment. I have discussed any further diagnostic evaluation that may be needed or ordered today. We also reviewed his medications today. he has been encouraged to call the office with any questions or concerns that should arise related to todays visit.    Orders Placed This Encounter  Procedures  . POCT HgB A1C    Meds ordered this encounter  Medications  . Dulaglutide (TRULICITY) 1.5 KG/8.1EH SOPN    Sig: Inject 1.5 mg into the skin once a week.    Dispense:  12 pen    Refill:  1  . zolpidem (AMBIEN) 10 MG tablet    Sig: Take 1 tablet (10 mg total) by mouth at bedtime as needed for sleep.    Dispense:  90 tablet    Refill:  1  . DISCONTD: oxyCODONE-acetaminophen (ROXICET) 5-325 MG tablet    Sig: Take 1-2 tablets by mouth every 4 (four) hours as needed for severe pain.    Dispense:  30 tablet    Refill:  0  . esomeprazole (NEXIUM) 40 MG capsule    Sig: Take 1 capsule (40 mg total) by mouth daily at 12 noon.    Dispense:  90 capsule    Refill:  1  . oxyCODONE-acetaminophen (ROXICET) 5-325 MG tablet    Sig: Take 1-2 tablets by mouth every 4 (four) hours as needed for severe pain.    Dispense:  30 tablet    Refill:  0    Time spent: 35 Minutes   This patient was seen by Orson Gear AGNP-C in Collaboration with Dr Lavera Guise as a part of collaborative care agreement     Kendell Bane AGNP-C Internal medicine

## 2018-05-22 MED ORDER — OXYCODONE-ACETAMINOPHEN 5-325 MG PO TABS: 1.0000 | ORAL_TABLET | ORAL | 0 refills | Status: DC | PRN

## 2018-07-29 ENCOUNTER — Other Ambulatory Visit: Payer: Self-pay | Admitting: Adult Health

## 2018-07-29 DIAGNOSIS — Z0001 Encounter for general adult medical examination with abnormal findings: Secondary | ICD-10-CM | POA: Diagnosis not present

## 2018-07-30 LAB — CBC WITH DIFFERENTIAL/PLATELET
BASOS ABS: 0 10*3/uL (ref 0.0–0.2)
Basos: 1 %
EOS (ABSOLUTE): 0 10*3/uL (ref 0.0–0.4)
Eos: 0 %
HEMOGLOBIN: 14.8 g/dL (ref 13.0–17.7)
Hematocrit: 44 % (ref 37.5–51.0)
IMMATURE GRANS (ABS): 0 10*3/uL (ref 0.0–0.1)
Immature Granulocytes: 1 %
Lymphocytes Absolute: 1.7 10*3/uL (ref 0.7–3.1)
Lymphs: 33 %
MCH: 27.8 pg (ref 26.6–33.0)
MCHC: 33.6 g/dL (ref 31.5–35.7)
MCV: 83 fL (ref 79–97)
MONOCYTES: 8 %
Monocytes Absolute: 0.4 10*3/uL (ref 0.1–0.9)
NEUTROS PCT: 57 %
Neutrophils Absolute: 2.8 10*3/uL (ref 1.4–7.0)
PLATELETS: 326 10*3/uL (ref 150–450)
RBC: 5.33 x10E6/uL (ref 4.14–5.80)
RDW: 15.1 % (ref 12.3–15.4)
WBC: 5 10*3/uL (ref 3.4–10.8)

## 2018-07-30 LAB — COMPREHENSIVE METABOLIC PANEL
ALBUMIN: 4.6 g/dL (ref 3.5–5.5)
ALK PHOS: 98 IU/L (ref 39–117)
ALT: 45 IU/L — AB (ref 0–44)
AST: 25 IU/L (ref 0–40)
Albumin/Globulin Ratio: 1.9 (ref 1.2–2.2)
BILIRUBIN TOTAL: 0.4 mg/dL (ref 0.0–1.2)
BUN / CREAT RATIO: 12 (ref 9–20)
BUN: 13 mg/dL (ref 6–24)
CHLORIDE: 103 mmol/L (ref 96–106)
CO2: 21 mmol/L (ref 20–29)
CREATININE: 1.07 mg/dL (ref 0.76–1.27)
Calcium: 9.7 mg/dL (ref 8.7–10.2)
GFR calc Af Amer: 92 mL/min/{1.73_m2} (ref >59)
GFR calc non Af Amer: 80 mL/min/{1.73_m2} (ref >59)
GLUCOSE: 177 mg/dL — AB (ref 65–99)
Globulin, Total: 2.4 g/dL (ref 1.5–4.5)
Potassium: 4.4 mmol/L (ref 3.5–5.2)
Sodium: 141 mmol/L (ref 134–144)
Total Protein: 7 g/dL (ref 6.0–8.5)

## 2018-07-30 LAB — LIPID PANEL WITH LDL/HDL RATIO
CHOLESTEROL TOTAL: 147 mg/dL (ref 100–199)
HDL: 35 mg/dL — ABNORMAL LOW (ref >39)
LDL Calculated: 89 mg/dL (ref 0–99)
LDl/HDL Ratio: 2.5 ratio (ref 0.0–3.6)
Triglycerides: 113 mg/dL (ref 0–149)
VLDL Cholesterol Cal: 23 mg/dL (ref 5–40)

## 2018-07-30 LAB — TSH: TSH: 1.22 u[IU]/mL (ref 0.450–4.500)

## 2018-07-30 LAB — PSA: Prostate Specific Ag, Serum: 0.4 ng/mL (ref 0.0–4.0)

## 2018-07-30 LAB — T4, FREE: Free T4: 1.3 ng/dL (ref 0.82–1.77)

## 2018-09-06 ENCOUNTER — Ambulatory Visit (INDEPENDENT_AMBULATORY_CARE_PROVIDER_SITE_OTHER): Payer: BLUE CROSS/BLUE SHIELD | Admitting: Adult Health

## 2018-09-06 ENCOUNTER — Other Ambulatory Visit: Payer: Self-pay | Admitting: Adult Health

## 2018-09-06 ENCOUNTER — Encounter: Payer: Self-pay | Admitting: Adult Health

## 2018-09-06 VITALS — BP 136/90 | HR 91 | Resp 16 | Ht 75.0 in | Wt 234.0 lb

## 2018-09-06 DIAGNOSIS — E1165 Type 2 diabetes mellitus with hyperglycemia: Secondary | ICD-10-CM

## 2018-09-06 DIAGNOSIS — K219 Gastro-esophageal reflux disease without esophagitis: Secondary | ICD-10-CM | POA: Diagnosis not present

## 2018-09-06 DIAGNOSIS — M549 Dorsalgia, unspecified: Secondary | ICD-10-CM | POA: Diagnosis not present

## 2018-09-06 DIAGNOSIS — F17219 Nicotine dependence, cigarettes, with unspecified nicotine-induced disorders: Secondary | ICD-10-CM

## 2018-09-06 DIAGNOSIS — R300 Dysuria: Secondary | ICD-10-CM

## 2018-09-06 DIAGNOSIS — E782 Mixed hyperlipidemia: Secondary | ICD-10-CM

## 2018-09-06 DIAGNOSIS — Z0001 Encounter for general adult medical examination with abnormal findings: Secondary | ICD-10-CM | POA: Diagnosis not present

## 2018-09-06 DIAGNOSIS — F5101 Primary insomnia: Secondary | ICD-10-CM

## 2018-09-06 DIAGNOSIS — G8929 Other chronic pain: Secondary | ICD-10-CM

## 2018-09-06 DIAGNOSIS — I10 Essential (primary) hypertension: Secondary | ICD-10-CM

## 2018-09-06 DIAGNOSIS — F329 Major depressive disorder, single episode, unspecified: Secondary | ICD-10-CM

## 2018-09-06 LAB — POCT GLYCOSYLATED HEMOGLOBIN (HGB A1C): Hemoglobin A1C: 8.2 % — AB (ref 4.0–5.6)

## 2018-09-06 MED ORDER — OXYCODONE-ACETAMINOPHEN 7.5-325 MG PO TABS: 1.0000 | ORAL_TABLET | Freq: Four times a day (QID) | ORAL | 0 refills | Status: DC | PRN

## 2018-09-06 MED ORDER — ACCU-CHEK MULTICLIX LANCETS MISC: 12 refills | Status: DC

## 2018-09-06 MED ORDER — GLUCOSE BLOOD VI STRP: ORAL_STRIP | 12 refills | Status: DC

## 2018-09-06 MED ORDER — ESOMEPRAZOLE MAGNESIUM 20 MG PO CPDR: 20.0000 mg | DELAYED_RELEASE_CAPSULE | Freq: Two times a day (BID) | ORAL | 1 refills | Status: DC

## 2018-09-06 MED ORDER — ESOMEPRAZOLE MAGNESIUM 20 MG PO PACK: 20.0000 mg | PACK | Freq: Every day | ORAL | 1 refills | Status: DC

## 2018-09-06 NOTE — Progress Notes (Signed)
na

## 2018-09-06 NOTE — Progress Notes (Signed)
[facility] [place], [place] [address]  Internal MEDICINE  Office Visit Note  Patient Name: Clinton Moore  [id number]  [id number]  Date of Service: 09/06/2018  Chief Complaint  Patient presents with  . Annual Exam  . Diabetes  . Hypertension  . Gastroesophageal Reflux  . Depression  . Quality Metric Gaps    foot exam      HPI Pt is here for routine health maintenance examination.  Patient has a history of diabetes, hypertension, GERD, and depression.  His blood pressures currently well controlled.  His previous A1c was 13.7 in October however today he has improved to 8.2.  He reports he has been taking his Trulicity shot weekly and trying to make better choices in his diet.  He does continue to report GERD symptoms, and is requesting a refill on his Nexium.  His depression is currently well controlled he denies any issues at this time.  He is in need of a diabetic foot exam to close his quality metric gaps.  Current Medication: Outpatient Encounter Medications as of 09/06/2018  Medication Sig  . amLODipine (NORVASC) 5 MG tablet Take 1 tablet (5 mg total) by mouth daily.  . cyclobenzaprine (FLEXERIL) 10 MG tablet Take 10 mg by mouth 3 (three) times daily as needed for spasms.  . Dulaglutide (TRULICITY) 1.5 AG/5.3MI SOPN Inject 1.5 mg into the skin once a week.  . fluticasone (FLONASE) 50 MCG/ACT nasal spray Place 1-2 sprays into both nostrils daily as needed. For allergies.  Marland Kitchen glimepiride (AMARYL) 2 MG tablet TAKE 1 TABLET WITH LARGEST MEAL OF THE DAY  . Melatonin-Pyridoxine (MELATONIN/VITAMIN B-6 EX ST) 5-1 MG TABS Take 1 tablet by mouth at bedtime as needed. For sleep.  . meloxicam (MOBIC) 15 MG tablet TAKE 1 TABLET DAILY AS NEEDED FOR PAIN  . Multiple Vitamin (MULTIVITAMIN WITH MINERALS) TABS tablet Take 1 tablet by mouth daily. men multivitamin  . oxyCODONE-acetaminophen (PERCOCET) 7.5-325 MG tablet Take 1 tablet by mouth every 6 (six) hours as  needed for severe pain.  Marland Kitchen quinapril (ACCUPRIL) 10 MG tablet Take 1 tablet (10 mg total) by mouth daily.  . traMADol (ULTRAM) 50 MG tablet Take 1 tablet po BID prn pain  . zolpidem (AMBIEN) 10 MG tablet Take 1 tablet (10 mg total) by mouth at bedtime as needed for sleep.  . [DISCONTINUED] esomeprazole (NEXIUM) 40 MG capsule Take 1 capsule (40 mg total) by mouth daily at 12 noon.  . [DISCONTINUED] oxyCODONE-acetaminophen (PERCOCET) 7.5-325 MG tablet Take 1 tablet by mouth every 6 (six) hours as needed for severe pain.  Marland Kitchen glucose blood (ACCU-CHEK GUIDE) test strip Use as instructed  . Lancets (ACCU-CHEK MULTICLIX) lancets Use as instructed  . [DISCONTINUED] esomeprazole (NEXIUM) 20 MG packet Take 20 mg by mouth daily before breakfast.  . [DISCONTINUED] oxyCODONE-acetaminophen (ROXICET) 5-325 MG tablet Take 1-2 tablets by mouth every 4 (four) hours as needed for severe pain. (Patient not taking: Reported on 09/06/2018)   No facility-administered encounter medications on file as of 09/06/2018.     Surgical History: Past Surgical History:  Procedure Laterality Date  . FOOT MASS EXCISION Right 2007  . PLANTAR FASCIA RELEASE Left 06/09/2016   Procedure: Radical excision plantar fibromas of left plantar arch and left hallux;  Surgeon: Sharlotte Alamo, DPM;  Location: [facility] ORS;  Service: Podiatry;  Laterality: Left;    Medical History: Past Medical History:  Diagnosis Date  . Anxiety   . Depression   . Diabetes mellitus  without complication ([place])   . GERD (gastroesophageal reflux disease)   . Headache    history of migraines  . Hypertension     Family History: Family History  Problem Relation Age of Onset  . Diabetes Father   . Hypertension Father       Review of Systems  Constitutional: Negative.  Negative for chills, fatigue and unexpected weight change.  HENT: Negative.  Negative for congestion, rhinorrhea, sneezing and sore throat.   Eyes: Negative for redness.  Respiratory:  Negative.  Negative for cough, chest tightness and shortness of breath.   Cardiovascular: Negative.  Negative for chest pain and palpitations.  Gastrointestinal: Negative.  Negative for abdominal pain, constipation, diarrhea, nausea and vomiting.  Endocrine: Negative.   Genitourinary: Negative.  Negative for dysuria and frequency.  Musculoskeletal: Negative.  Negative for arthralgias, back pain, joint swelling and neck pain.  Skin: Negative.  Negative for rash.  Allergic/Immunologic: Negative.   Neurological: Negative.  Negative for tremors and numbness.  Hematological: Negative for adenopathy. Does not bruise/bleed easily.  Psychiatric/Behavioral: Negative.  Negative for behavioral problems, sleep disturbance and suicidal ideas. The patient is not nervous/anxious.      Vital Signs: BP 136/90   Pulse 91   Resp 16   Ht 6\' 3"  (1.905 m)   Wt 234 lb (106.1 kg)   SpO2 95%   BMI 29.25 kg/m    Physical Exam Vitals signs and nursing note reviewed.  Constitutional:      General: He is not in acute distress.    Appearance: He is well-developed. He is not diaphoretic.  HENT:     Head: Normocephalic and atraumatic.     Mouth/Throat:     Pharynx: No oropharyngeal exudate.  Eyes:     Pupils: Pupils are equal, round, and reactive to light.  Neck:     Musculoskeletal: Normal range of motion and neck supple.     Thyroid: No thyromegaly.     Vascular: No JVD.     Trachea: No tracheal deviation.  Cardiovascular:     Rate and Rhythm: Normal rate and regular rhythm.     Heart sounds: Normal heart sounds. No murmur. No friction rub. No gallop.   Pulmonary:     Effort: Pulmonary effort is normal. No respiratory distress.     Breath sounds: Normal breath sounds. No wheezing or rales.  Chest:     Chest wall: No tenderness.  Abdominal:     Palpations: Abdomen is soft.     Tenderness: There is no abdominal tenderness. There is no guarding.  Musculoskeletal: Normal range of motion.   Lymphadenopathy:     Cervical: No cervical adenopathy.  Skin:    General: Skin is warm and dry.  Neurological:     Mental Status: He is alert and oriented to person, place, and time.     Cranial Nerves: No cranial nerve deficit.  Psychiatric:        Behavior: Behavior normal.        Thought Content: Thought content normal.        Judgment: Judgment normal.      LABS: Recent Results (from the past 2160 hour(s))  CBC with Differential/Platelet     Status: None   Collection Time: 07/29/18  9:38 AM  Result Value Ref Range   WBC 5.0 3.4 - 10.8 x10E3/uL   RBC 5.33 4.14 - 5.80 x10E6/uL   Hemoglobin 14.8 13.0 - 17.7 g/dL   Hematocrit 44.0 37.5 - 51.0 %   MCV  83 79 - 97 fL   MCH 27.8 26.6 - 33.0 pg   MCHC 33.6 31.5 - 35.7 g/dL   RDW 15.1 12.3 - 15.4 %    Comment: **Effective August 05, 2018, the RDW pediatric reference**   interval will be removed and the adult reference interval   will be changing to:                             Male 11.7 - 15.4                                                      Male 11.6 - 15.4    Platelets 326 150 - 450 x10E3/uL   Neutrophils 57 Not Estab. %   Lymphs 33 Not Estab. %   Monocytes 8 Not Estab. %   Eos 0 Not Estab. %   Basos 1 Not Estab. %   Neutrophils Absolute 2.8 1.4 - 7.0 x10E3/uL   Lymphocytes Absolute 1.7 0.7 - 3.1 x10E3/uL   Monocytes Absolute 0.4 0.1 - 0.9 x10E3/uL   EOS (ABSOLUTE) 0.0 0.0 - 0.4 x10E3/uL   Basophils Absolute 0.0 0.0 - 0.2 x10E3/uL   Immature Granulocytes 1 Not Estab. %   Immature Grans (Abs) 0.0 0.0 - 0.1 x10E3/uL  Comprehensive metabolic panel     Status: Abnormal   Collection Time: 07/29/18  9:38 AM  Result Value Ref Range   Glucose 177 (H) 65 - 99 mg/dL   BUN 13 6 - 24 mg/dL   Creatinine, Ser 1.07 0.76 - 1.27 mg/dL   GFR calc non Af Amer 80 >59 mL/min/1.73   GFR calc Af Amer 92 >59 mL/min/1.73   BUN/Creatinine Ratio 12 9 - 20   Sodium 141 134 - 144 mmol/L   Potassium 4.4 3.5 - 5.2 mmol/L   Chloride 103  96 - 106 mmol/L   CO2 21 20 - 29 mmol/L   Calcium 9.7 8.7 - 10.2 mg/dL   Total Protein 7.0 6.0 - 8.5 g/dL   Albumin 4.6 3.5 - 5.5 g/dL   Globulin, Total 2.4 1.5 - 4.5 g/dL   Albumin/Globulin Ratio 1.9 1.2 - 2.2   Bilirubin Total 0.4 0.0 - 1.2 mg/dL   Alkaline Phosphatase 98 39 - 117 IU/L   AST 25 0 - 40 IU/L   ALT 45 (H) 0 - 44 IU/L  Lipid Panel With LDL/HDL Ratio     Status: Abnormal   Collection Time: 07/29/18  9:38 AM  Result Value Ref Range   Cholesterol, Total 147 100 - 199 mg/dL   Triglycerides 113 0 - 149 mg/dL   HDL 35 (L) >39 mg/dL   VLDL Cholesterol Cal 23 5 - 40 mg/dL   LDL Calculated 89 0 - 99 mg/dL   LDl/HDL Ratio 2.5 0.0 - 3.6 ratio    Comment:                                     LDL/HDL Ratio  Men  Women                               1/2 Avg.Risk  1.0    1.5                                   Avg.Risk  3.6    3.2                                2X Avg.Risk  6.2    5.0                                3X Avg.Risk  8.0    6.1   T4, free     Status: None   Collection Time: 07/29/18  9:38 AM  Result Value Ref Range   Free T4 1.30 0.82 - 1.77 ng/dL  TSH     Status: None   Collection Time: 07/29/18  9:38 AM  Result Value Ref Range   TSH 1.220 0.450 - 4.500 uIU/mL  PSA     Status: None   Collection Time: 07/29/18  9:38 AM  Result Value Ref Range   Prostate Specific Ag, Serum 0.4 0.0 - 4.0 ng/mL    Comment: Roche ECLIA methodology. According to the American Urological Association, Serum PSA should decrease and remain at undetectable levels after radical prostatectomy. The AUA defines biochemical recurrence as an initial PSA value 0.2 ng/mL or greater followed by a subsequent confirmatory PSA value 0.2 ng/mL or greater. Values obtained with different assay methods or kits cannot be used interchangeably. Results cannot be interpreted as absolute evidence of the presence or absence of malignant disease.   POCT HgB A1C      Status: Abnormal   Collection Time: 09/06/18  8:55 AM  Result Value Ref Range   Hemoglobin A1C 8.2 (A) 4.0 - 5.6 %   HbA1c POC (<> result, manual entry)     HbA1c, POC (prediabetic range)     HbA1c, POC (controlled diabetic range)       Assessment/Plan: 1. Encounter for general adult medical examination with abnormal findings Patient is up-to-date on preventative health maintenance at this visit.  2. Uncontrolled type 2 diabetes mellitus with hyperglycemia ([place]) Patient's A1c much improved from 13.7-8.2 at visit.  Patient is requesting prescription for new meter strips and lancets.  Provided at this visit.  Encourage patient to continue to make good choices with diet and to take his medications as prescribed. - POCT HgB A1C - glucose blood (ACCU-CHEK GUIDE) test strip; Use as instructed  Dispense: 100 each; Refill: 12 - Lancets (ACCU-CHEK MULTICLIX) lancets; Use as instructed  Dispense: 100 each; Refill: 12  3. Chronic back pain greater than 3 months duration Patient's Percocet refilled for chronic back pain. - oxyCODONE-acetaminophen (PERCOCET) 7.5-325 MG tablet; Take 1 tablet by mouth every 6 (six) hours as needed for severe pain.  Dispense: 30 tablet; Refill: 0  4. Gastroesophageal reflux disease without esophagitis Patient's Nexium changed to 20 mg twice daily instead of 40 mg daily.  At his request.  Patient should continue using GERD medication as prescribed. - esomeprazole (NEXIUM) 20 MG tablet Take 20 mg by mouth daily before breakfast.    5. Cigarette nicotine dependence  with nicotine-induced disorder Smoking cessation counseling: 1. Pt acknowledges the risks of long term smoking, she will try to quite smoking. 2. Options for different medications including nicotine products, chewing gum, patch etc, Wellbutrin and Chantix is discussed 3. Goal and date of compete cessation is discussed 4. Total time spent in smoking cessation is 15 min.  6. Mixed hyperlipidemia Stable,  continue current therapy.  7. Essential (primary) hypertension Stable, continue current medications as prescribed.  8. Primary insomnia Stable, continue to use Ambien as needed for insomnia.  9. Chronic major depressive disorder, single episode Stable, patient currently denies any issues continue using medicines as prescribed.  10. Dysuria skewed by - UA/M w/rflx Culture, Routine  General Counseling: Renata Caprice understanding of the findings of todays visit and agrees with plan of treatment. I have discussed any further diagnostic evaluation that may be needed or ordered today. We also reviewed his medications today. he has been encouraged to call the office with any questions or concerns that should arise related to todays visit.   Orders Placed This Encounter  Procedures  . UA/M w/rflx Culture, Routine  . POCT HgB A1C    Meds ordered this encounter  Medications  . glucose blood (ACCU-CHEK GUIDE) test strip    Sig: Use as instructed    Dispense:  100 each    Refill:  12  . Lancets (ACCU-CHEK MULTICLIX) lancets    Sig: Use as instructed    Dispense:  100 each    Refill:  12  . oxyCODONE-acetaminophen (PERCOCET) 7.5-325 MG tablet    Sig: Take 1 tablet by mouth every 6 (six) hours as needed for severe pain.    Dispense:  30 tablet    Refill:  0  . DISCONTD: esomeprazole (NEXIUM) 20 MG packet    Sig: Take 20 mg by mouth daily before breakfast.    Dispense:  180 each    Refill:  1    Time spent: 35 Minutes   This patient was seen by Orson Gear AGNP-C in Collaboration with Dr Lavera Guise as a part of collaborative care agreement    Kendell Bane AGNP-C Internal Medicine

## 2018-09-07 LAB — MICROSCOPIC EXAMINATION
Bacteria, UA: NONE SEEN
Casts: NONE SEEN /lpf
EPITHELIAL CELLS (NON RENAL): NONE SEEN /HPF (ref 0–10)

## 2018-09-07 LAB — UA/M W/RFLX CULTURE, ROUTINE
Bilirubin, UA: NEGATIVE
Glucose, UA: NEGATIVE
Ketones, UA: NEGATIVE
Leukocytes, UA: NEGATIVE
Nitrite, UA: NEGATIVE
PROTEIN UA: NEGATIVE
RBC, UA: NEGATIVE
SPEC GRAV UA: 1.015 (ref 1.005–1.030)
Urobilinogen, Ur: 0.2 mg/dL (ref 0.2–1.0)
pH, UA: 5 (ref 5.0–7.5)

## 2018-09-16 ENCOUNTER — Other Ambulatory Visit: Payer: Self-pay

## 2018-09-17 ENCOUNTER — Other Ambulatory Visit: Payer: Self-pay | Admitting: Adult Health

## 2018-11-12 ENCOUNTER — Other Ambulatory Visit: Payer: Self-pay | Admitting: Adult Health

## 2018-11-12 DIAGNOSIS — E1165 Type 2 diabetes mellitus with hyperglycemia: Secondary | ICD-10-CM

## 2018-11-19 ENCOUNTER — Telehealth: Payer: Self-pay

## 2018-11-19 NOTE — Telephone Encounter (Signed)
Pt advised acuchek machine ready for pickup

## 2019-01-13 ENCOUNTER — Other Ambulatory Visit: Payer: Self-pay

## 2019-01-13 MED ORDER — ESOMEPRAZOLE MAGNESIUM 40 MG PO CPDR: DELAYED_RELEASE_CAPSULE | ORAL | 0 refills | Status: DC

## 2019-02-13 ENCOUNTER — Other Ambulatory Visit: Payer: Self-pay | Admitting: Adult Health

## 2019-02-13 DIAGNOSIS — E1165 Type 2 diabetes mellitus with hyperglycemia: Secondary | ICD-10-CM

## 2019-03-10 ENCOUNTER — Other Ambulatory Visit: Payer: Self-pay | Admitting: Adult Health

## 2019-03-10 ENCOUNTER — Other Ambulatory Visit: Payer: Self-pay

## 2019-03-10 ENCOUNTER — Ambulatory Visit: Payer: Self-pay | Admitting: Adult Health

## 2019-03-10 ENCOUNTER — Encounter: Payer: Self-pay | Admitting: Adult Health

## 2019-03-10 VITALS — BP 142/96 | HR 100 | Resp 16 | Ht 75.0 in | Wt 240.0 lb

## 2019-03-10 DIAGNOSIS — G4700 Insomnia, unspecified: Secondary | ICD-10-CM | POA: Diagnosis not present

## 2019-03-10 DIAGNOSIS — E1165 Type 2 diabetes mellitus with hyperglycemia: Secondary | ICD-10-CM

## 2019-03-10 DIAGNOSIS — I10 Essential (primary) hypertension: Secondary | ICD-10-CM

## 2019-03-10 DIAGNOSIS — M722 Plantar fascial fibromatosis: Secondary | ICD-10-CM

## 2019-03-10 LAB — POCT GLYCOSYLATED HEMOGLOBIN (HGB A1C): Hemoglobin A1C: 9.4 % — AB (ref 4.0–5.6)

## 2019-03-10 MED ORDER — ZOLPIDEM TARTRATE 10 MG PO TABS: 10.0000 mg | ORAL_TABLET | Freq: Every evening | ORAL | 1 refills | Status: DC | PRN

## 2019-03-10 MED ORDER — AMLODIPINE BESYLATE 5 MG PO TABS: 5.0000 mg | ORAL_TABLET | Freq: Every day | ORAL | 4 refills | Status: DC

## 2019-03-10 MED ORDER — GLIMEPIRIDE 4 MG PO TABS: ORAL_TABLET | ORAL | 1 refills | Status: DC

## 2019-03-10 MED ORDER — TRAMADOL HCL 50 MG PO TABS: ORAL_TABLET | ORAL | 1 refills | Status: DC

## 2019-03-10 MED ORDER — ESOMEPRAZOLE MAGNESIUM 40 MG PO CPDR: DELAYED_RELEASE_CAPSULE | ORAL | 2 refills | Status: DC

## 2019-03-10 MED ORDER — AMLODIPINE BESYLATE 10 MG PO TABS: 5.0000 mg | ORAL_TABLET | Freq: Every day | ORAL | 1 refills | Status: DC

## 2019-03-10 MED ORDER — FLUTICASONE PROPIONATE 50 MCG/ACT NA SUSP: 1.0000 | Freq: Every day | NASAL | 3 refills | Status: DC | PRN

## 2019-03-10 NOTE — Progress Notes (Signed)
[facility] [place],  [address]  Internal MEDICINE  Office Visit Note  Patient Name: Clinton Moore  [id number]  [id number]  Date of Service: 03/10/2019  Chief Complaint  Patient presents with  . Medical Management of Chronic Issues    medication refills , tramadol, ambien   . Diabetes  . Hypertension  . Insomnia    HPI  Pt is here for follow up on DM.  His A1C is currently 9.4.  He is not interested in adding a daily injection at this time.  His bp is slightly elevated today. Overall he feels like he is doing well.  He is requesting refills on his tramadol and ambien.      Current Medication: Outpatient Encounter Medications as of 03/10/2019  Medication Sig  . amLODipine (NORVASC) 5 MG tablet Take 1 tablet (5 mg total) by mouth daily.  . cyclobenzaprine (FLEXERIL) 10 MG tablet Take 10 mg by mouth 3 (three) times daily as needed for spasms.  Marland Kitchen esomeprazole (NEXIUM) 40 MG capsule Take 1 cap po daily  . fluticasone (FLONASE) 50 MCG/ACT nasal spray Place 1-2 sprays into both nostrils daily as needed. For allergies.  Marland Kitchen glimepiride (AMARYL) 2 MG tablet TAKE 1 TABLET WITH LARGEST MEAL OF THE DAY  . glucose blood (ACCU-CHEK GUIDE) test strip Use as instructed  . Lancets (ACCU-CHEK MULTICLIX) lancets Use as instructed  . Melatonin-Pyridoxine (MELATONIN/VITAMIN B-6 EX ST) 5-1 MG TABS Take 1 tablet by mouth at bedtime as needed. For sleep.  . meloxicam (MOBIC) 15 MG tablet TAKE 1 TABLET DAILY AS NEEDED FOR PAIN  . Multiple Vitamin (MULTIVITAMIN WITH MINERALS) TABS tablet Take 1 tablet by mouth daily. men multivitamin  . oxyCODONE-acetaminophen (PERCOCET) 7.5-325 MG tablet Take 1 tablet by mouth every 6 (six) hours as needed for severe pain.  Marland Kitchen quinapril (ACCUPRIL) 10 MG tablet Take 1 tablet (10 mg total) by mouth daily.  . traMADol (ULTRAM) 50 MG tablet Take 1 tablet po BID prn pain  . TRULICITY 1.5 OJ/5.0KX SOPN INJECT 1.5 MG INTO THE SKIN ONCE A  WEEK  . [DISCONTINUED] amLODipine (NORVASC) 5 MG tablet Take 1 tablet (5 mg total) by mouth daily.  . [DISCONTINUED] esomeprazole (NEXIUM) 40 MG capsule Take 1 cap po daily  . [DISCONTINUED] fluticasone (FLONASE) 50 MCG/ACT nasal spray Place 1-2 sprays into both nostrils daily as needed. For allergies.  Marland Kitchen zolpidem (AMBIEN) 10 MG tablet Take 1 tablet (10 mg total) by mouth at bedtime as needed for sleep.  . [DISCONTINUED] esomeprazole (NEXIUM) 20 MG capsule Take 1 capsule (20 mg total) by mouth 2 (two) times daily before a meal. (Patient not taking: Reported on 01/13/2019)   No facility-administered encounter medications on file as of 03/10/2019.     Surgical History: Past Surgical History:  Procedure Laterality Date  . FOOT MASS EXCISION Right 2007  . PLANTAR FASCIA RELEASE Left 06/09/2016   Procedure: Radical excision plantar fibromas of left plantar arch and left hallux;  Surgeon: Sharlotte Alamo, DPM;  Location: [facility] ORS;  Service: Podiatry;  Laterality: Left;    Medical History: Past Medical History:  Diagnosis Date  . Anxiety   . Depression   . Diabetes mellitus without complication ([place])   . GERD (gastroesophageal reflux disease)   . Headache    history of migraines  . Hypertension     Family History: Family History  Problem Relation Age of Onset  . Diabetes Father   . Hypertension Father  Social History   Socioeconomic History  . Marital status: Married    Spouse name: Not on file  . Number of children: Not on file  . Years of education: Not on file  . Highest education level: Not on file  Occupational History  . Not on file  Social Needs  . Financial resource strain: Not on file  . Food insecurity    Worry: Not on file    Inability: Not on file  . Transportation needs    Medical: Not on file    Non-medical: Not on file  Tobacco Use  . Smoking status: Former Smoker    Types: Cigarettes  . Smokeless tobacco: Never Used  . Tobacco comment: a few cigarettes  per week  Substance and Sexual Activity  . Alcohol use: Yes    Alcohol/week: 3.0 standard drinks    Types: 3 Cans of beer per week  . Drug use: No  . Sexual activity: Never  Lifestyle  . Physical activity    Days per week: Not on file    Minutes per session: Not on file  . Stress: Not on file  Relationships  . Social Herbalist on phone: Not on file    Gets together: Not on file    Attends religious service: Not on file    Active member of club or organization: Not on file    Attends meetings of clubs or organizations: Not on file    Relationship status: Not on file  . Intimate partner violence    Fear of current or ex partner: Not on file    Emotionally abused: Not on file    Physically abused: Not on file    Forced sexual activity: Not on file  Other Topics Concern  . Not on file  Social History Narrative  . Not on file      Review of Systems  Constitutional: Negative.  Negative for chills, fatigue and unexpected weight change.  HENT: Negative.  Negative for congestion, rhinorrhea, sneezing and sore throat.   Eyes: Negative for redness.  Respiratory: Negative.  Negative for cough, chest tightness and shortness of breath.   Cardiovascular: Negative.  Negative for chest pain and palpitations.  Gastrointestinal: Negative.  Negative for abdominal pain, constipation, diarrhea, nausea and vomiting.  Endocrine: Negative.   Genitourinary: Negative.  Negative for dysuria and frequency.  Musculoskeletal: Negative.  Negative for arthralgias, back pain, joint swelling and neck pain.  Skin: Negative.  Negative for rash.  Allergic/Immunologic: Negative.   Neurological: Negative.  Negative for tremors and numbness.  Hematological: Negative for adenopathy. Does not bruise/bleed easily.  Psychiatric/Behavioral: Negative.  Negative for behavioral problems, sleep disturbance and suicidal ideas. The patient is not nervous/anxious.     Vital Signs: BP (!) 142/96   Pulse 100    Resp 16   Ht 6\' 3"  (1.905 m)   Wt 240 lb (108.9 kg)   SpO2 97%   BMI 30.00 kg/m    Physical Exam Vitals signs and nursing note reviewed.  Constitutional:      General: He is not in acute distress.    Appearance: He is well-developed. He is not diaphoretic.  HENT:     Head: Normocephalic and atraumatic.     Mouth/Throat:     Pharynx: No oropharyngeal exudate.  Eyes:     Pupils: Pupils are equal, round, and reactive to light.  Neck:     Musculoskeletal: Normal range of motion and neck supple.  Thyroid: No thyromegaly.     Vascular: No JVD.     Trachea: No tracheal deviation.  Cardiovascular:     Rate and Rhythm: Normal rate and regular rhythm.     Heart sounds: Normal heart sounds. No murmur. No friction rub. No gallop.   Pulmonary:     Effort: Pulmonary effort is normal. No respiratory distress.     Breath sounds: Normal breath sounds. No wheezing or rales.  Chest:     Chest wall: No tenderness.  Abdominal:     Palpations: Abdomen is soft.     Tenderness: There is no abdominal tenderness. There is no guarding.  Musculoskeletal: Normal range of motion.  Lymphadenopathy:     Cervical: No cervical adenopathy.  Skin:    General: Skin is warm and dry.  Neurological:     Mental Status: He is alert and oriented to person, place, and time.     Cranial Nerves: No cranial nerve deficit.  Psychiatric:        Behavior: Behavior normal.        Thought Content: Thought content normal.        Judgment: Judgment normal.      Assessment/Plan: 1. Uncontrolled type 2 diabetes mellitus with hyperglycemia (HCC) Increase Amaryl to 4mg  daily with first meal.   - POCT HgB A1C - glimepiride (AMARYL) 4 MG tablet; TAKE 1 TABLET WITH LARGEST MEAL OF THE DAY  Dispense: 90 tablet; Refill: 1  2. Essential hypertension Increase Norvasc to 10mg  daily.   3. Insomnia, unspecified type Reviewed risks and possible side effects associated with taking opiates, benzodiazepines and other CNS  depressants. Combination of these could cause dizziness and drowsiness. Advised patient not to drive or operate machinery when taking these medications, as patient's and other's life can be at risk and will have consequences. Patient verbalized understanding in this matter. Dependence and abuse for these drugs will be monitored closely. A Controlled substance policy and procedure is on file which allows [facility] to order a urine drug screen test at any visit. Patient understands and agrees with the plan - zolpidem (AMBIEN) 10 MG tablet; Take 1 tablet (10 mg total) by mouth at bedtime as needed for sleep.  Dispense: 90 tablet; Refill: 1  4. Plantar fascial fibromatosis Reviewed risks and possible side effects associated with taking opiates, benzodiazepines and other CNS depressants. Combination of these could cause dizziness and drowsiness. Advised patient not to drive or operate machinery when taking these medications, as patient's and other's life can be at risk and will have consequences. Patient verbalized understanding in this matter. Dependence and abuse for these drugs will be monitored closely. A Controlled substance policy and procedure is on file which allows [facility] to order a urine drug screen test at any visit. Patient understands and agrees with the plan - traMADol (ULTRAM) 50 MG tablet; Take 1 tablet po BID prn pain  Dispense: 180 tablet; Refill: 1  General Counseling: Renata Caprice understanding of the findings of todays visit and agrees with plan of treatment. I have discussed any further diagnostic evaluation that may be needed or ordered today. We also reviewed his medications today. he has been encouraged to call the office with any questions or concerns that should arise related to todays visit.    Orders Placed This Encounter  Procedures  . POCT HgB A1C    No orders of the defined types were placed in this encounter.   Time spent: 20  Minutes  This patient was seen by Orson Gear AGNP-C in Collaboration with Dr Lavera Guise as a part of collaborative care agreement     Kendell Bane AGNP-C Internal medicine

## 2019-03-11 ENCOUNTER — Other Ambulatory Visit: Payer: Self-pay | Admitting: Internal Medicine

## 2019-03-11 DIAGNOSIS — E1165 Type 2 diabetes mellitus with hyperglycemia: Secondary | ICD-10-CM

## 2019-03-11 MED ORDER — TRULICITY 1.5 MG/0.5ML ~~LOC~~ SOAJ: SUBCUTANEOUS | 2 refills | Status: DC

## 2019-03-11 MED ORDER — ETODOLAC 400 MG PO TABS: 400.0000 mg | ORAL_TABLET | Freq: Two times a day (BID) | ORAL | 1 refills | Status: DC

## 2019-03-13 ENCOUNTER — Other Ambulatory Visit: Payer: Self-pay | Admitting: Adult Health

## 2019-03-13 MED ORDER — AMLODIPINE BESYLATE 10 MG PO TABS: 10.0000 mg | ORAL_TABLET | Freq: Every day | ORAL | 1 refills | Status: DC

## 2019-03-31 ENCOUNTER — Other Ambulatory Visit: Payer: Self-pay

## 2019-03-31 MED ORDER — QUINAPRIL HCL 10 MG PO TABS: 10.0000 mg | ORAL_TABLET | Freq: Every day | ORAL | 1 refills | Status: DC

## 2019-06-10 ENCOUNTER — Other Ambulatory Visit: Payer: Self-pay

## 2019-06-10 DIAGNOSIS — Z20822 Contact with and (suspected) exposure to covid-19: Secondary | ICD-10-CM

## 2019-06-12 ENCOUNTER — Telehealth: Payer: Self-pay

## 2019-06-12 NOTE — Telephone Encounter (Signed)
Confirmed video call with patient (patient was exposed to covid) klh

## 2019-06-13 LAB — NOVEL CORONAVIRUS, NAA: SARS-CoV-2, NAA: NOT DETECTED

## 2019-06-16 ENCOUNTER — Encounter: Payer: Self-pay | Admitting: Adult Health

## 2019-06-16 ENCOUNTER — Ambulatory Visit (INDEPENDENT_AMBULATORY_CARE_PROVIDER_SITE_OTHER): Payer: Self-pay | Admitting: Adult Health

## 2019-06-16 ENCOUNTER — Other Ambulatory Visit: Payer: Self-pay

## 2019-06-16 ENCOUNTER — Telehealth: Payer: Self-pay | Admitting: Internal Medicine

## 2019-06-16 DIAGNOSIS — I10 Essential (primary) hypertension: Secondary | ICD-10-CM | POA: Diagnosis not present

## 2019-06-16 DIAGNOSIS — F17219 Nicotine dependence, cigarettes, with unspecified nicotine-induced disorders: Secondary | ICD-10-CM | POA: Diagnosis not present

## 2019-06-16 DIAGNOSIS — Z9189 Other specified personal risk factors, not elsewhere classified: Secondary | ICD-10-CM

## 2019-06-16 NOTE — Telephone Encounter (Signed)
Negative COVID results given. Patient results "NOT Detected." Caller expressed understanding. ° °

## 2019-06-16 NOTE — Progress Notes (Signed)
[facility] [place], [place] [address]  Internal MEDICINE  Telephone Visit  Patient Name: Clinton Moore  [id number]  LP:[id number]  Date of Service: 06/16/2019  I connected with the patient at 425 by telephone and verified the patients identity using two identifiers.   I discussed the limitations, risks, security and privacy concerns of performing an evaluation and management service by telephone and the availability of in person appointments. I also discussed with the patient that there may be a patient responsible charge related to the service.  The patient expressed understanding and agrees to proceed.    Chief Complaint  Patient presents with  . Telephone Assessment  . Telephone Screen  . Diabetes    HPI  PT is seen via video.  He reports his friends wife tested positive for covid.  He was around his friend and not the wife, but his job has asked him to quarantine for 14 days.  He is in need of a negative covid test before he can return to work. He has a little bit of cough, but denies any other symptoms.  He is doing well, and staying at home currently.     Current Medication: Outpatient Encounter Medications as of 06/16/2019  Medication Sig  . amLODipine (NORVASC) 10 MG tablet Take 1 tablet (10 mg total) by mouth daily.  . cyclobenzaprine (FLEXERIL) 10 MG tablet Take 10 mg by mouth 3 (three) times daily as needed for spasms.  . Dulaglutide (TRULICITY) 1.5 0000000 SOPN INJECT 1.5 MG INTO THE SKIN ONCE A WEEK  . esomeprazole (NEXIUM) 40 MG capsule Take 1 cap po daily  . etodolac (LODINE) 400 MG tablet Take 1 tablet (400 mg total) by mouth 2 (two) times daily.  . fluticasone (FLONASE) 50 MCG/ACT nasal spray Place 1-2 sprays into both nostrils daily as needed. For allergies.  Marland Kitchen glimepiride (AMARYL) 4 MG tablet TAKE 1 TABLET WITH LARGEST MEAL OF THE DAY  . glucose blood (ACCU-CHEK GUIDE) test strip Use as instructed  . Lancets (ACCU-CHEK MULTICLIX)  lancets Use as instructed  . Melatonin-Pyridoxine (MELATONIN/VITAMIN B-6 EX ST) 5-1 MG TABS Take 1 tablet by mouth at bedtime as needed. For sleep.  . meloxicam (MOBIC) 15 MG tablet TAKE 1 TABLET DAILY AS NEEDED FOR PAIN  . Multiple Vitamin (MULTIVITAMIN WITH MINERALS) TABS tablet Take 1 tablet by mouth daily. men multivitamin  . oxyCODONE-acetaminophen (PERCOCET) 7.5-325 MG tablet Take 1 tablet by mouth every 6 (six) hours as needed for severe pain.  Marland Kitchen quinapril (ACCUPRIL) 10 MG tablet Take 1 tablet (10 mg total) by mouth daily.  . traMADol (ULTRAM) 50 MG tablet Take 1 tablet po BID prn pain  . zolpidem (AMBIEN) 10 MG tablet Take 1 tablet (10 mg total) by mouth at bedtime as needed for sleep.   No facility-administered encounter medications on file as of 06/16/2019.     Surgical History: Past Surgical History:  Procedure Laterality Date  . FOOT MASS EXCISION Right 2007  . PLANTAR FASCIA RELEASE Left 06/09/2016   Procedure: Radical excision plantar fibromas of left plantar arch and left hallux;  Surgeon: Sharlotte Alamo, DPM;  Location: [facility] ORS;  Service: Podiatry;  Laterality: Left;    Medical History: Past Medical History:  Diagnosis Date  . Anxiety   . Depression   . Diabetes mellitus without complication ([place])   . GERD (gastroesophageal reflux disease)   . Headache    history of migraines  . Hypertension     Family History:  Family History  Problem Relation Age of Onset  . Diabetes Father   . Hypertension Father     Social History   Socioeconomic History  . Marital status: Married    Spouse name: Not on file  . Number of children: Not on file  . Years of education: Not on file  . Highest education level: Not on file  Occupational History  . Not on file  Social Needs  . Financial resource strain: Not on file  . Food insecurity    Worry: Not on file    Inability: Not on file  . Transportation needs    Medical: Not on file    Non-medical: Not on file  Tobacco Use   . Smoking status: Former Smoker    Types: Cigarettes  . Smokeless tobacco: Never Used  . Tobacco comment: a few cigarettes per week  Substance and Sexual Activity  . Alcohol use: Yes    Alcohol/week: 3.0 standard drinks    Types: 3 Cans of beer per week  . Drug use: No  . Sexual activity: Never  Lifestyle  . Physical activity    Days per week: Not on file    Minutes per session: Not on file  . Stress: Not on file  Relationships  . Social Herbalist on phone: Not on file    Gets together: Not on file    Attends religious service: Not on file    Active member of club or organization: Not on file    Attends meetings of clubs or organizations: Not on file    Relationship status: Not on file  . Intimate partner violence    Fear of current or ex partner: Not on file    Emotionally abused: Not on file    Physically abused: Not on file    Forced sexual activity: Not on file  Other Topics Concern  . Not on file  Social History Narrative  . Not on file      Review of Systems  Constitutional: Negative.  Negative for chills, fatigue and unexpected weight change.  HENT: Negative.  Negative for congestion, rhinorrhea, sneezing and sore throat.   Eyes: Negative for redness.  Respiratory: Negative.  Negative for cough, chest tightness and shortness of breath.   Cardiovascular: Negative.  Negative for chest pain and palpitations.  Gastrointestinal: Negative.  Negative for abdominal pain, constipation, diarrhea, nausea and vomiting.  Endocrine: Negative.   Genitourinary: Negative.  Negative for dysuria and frequency.  Musculoskeletal: Negative.  Negative for arthralgias, back pain, joint swelling and neck pain.  Skin: Negative.  Negative for rash.  Allergic/Immunologic: Negative.   Neurological: Negative.  Negative for tremors and numbness.  Hematological: Negative for adenopathy. Does not bruise/bleed easily.  Psychiatric/Behavioral: Negative.  Negative for behavioral  problems, sleep disturbance and suicidal ideas. The patient is not nervous/anxious.     Vital Signs: There were no vitals taken for this visit.   Observation/Objective:  Well appearing, NAD noted.    Assessment/Plan: 1. At increased risk of exposure to COVID-19 virus Will get covid testing now, and follow patient accordingly.   2. Essential hypertension Stable, pt denies issues.  Continue present management, and follow up in 2 weeks.  3. Cigarette nicotine dependence with nicotine-induced disorder Smoking cessation counseling: 1. Pt acknowledges the risks of long term smoking, she will try to quite smoking. 2. Options for different medications including nicotine products, chewing gum, patch etc, Wellbutrin and Chantix is discussed 3. Goal and date  of compete cessation is discussed 4. Total time spent in smoking cessation is 15 min.   General Counseling: kayne coultas understanding of the findings of today's phone visit and agrees with plan of treatment. I have discussed any further diagnostic evaluation that may be needed or ordered today. We also reviewed his medications today. he has been encouraged to call the office with any questions or concerns that should arise related to todays visit.    No orders of the defined types were placed in this encounter.   No orders of the defined types were placed in this encounter.   Time spent: [place] AGNP-C Internal medicine

## 2019-06-19 ENCOUNTER — Telehealth: Payer: Self-pay

## 2019-07-03 ENCOUNTER — Telehealth: Payer: Self-pay

## 2019-07-03 NOTE — Telephone Encounter (Signed)
Called lmom informing patient of appointment. klh 

## 2019-07-07 ENCOUNTER — Ambulatory Visit: Payer: BC Managed Care – PPO | Admitting: Adult Health

## 2019-07-14 ENCOUNTER — Telehealth: Payer: Self-pay

## 2019-07-14 NOTE — Telephone Encounter (Signed)
Confirmed appointment with patient. klh °

## 2019-07-16 ENCOUNTER — Encounter: Payer: Self-pay | Admitting: Adult Health

## 2019-07-16 ENCOUNTER — Other Ambulatory Visit: Payer: Self-pay

## 2019-07-16 ENCOUNTER — Ambulatory Visit (INDEPENDENT_AMBULATORY_CARE_PROVIDER_SITE_OTHER): Payer: BC Managed Care – PPO | Admitting: Adult Health

## 2019-07-16 VITALS — BP 134/99 | HR 88 | Resp 16 | Ht 75.0 in | Wt 246.0 lb

## 2019-07-16 DIAGNOSIS — M549 Dorsalgia, unspecified: Secondary | ICD-10-CM | POA: Diagnosis not present

## 2019-07-16 DIAGNOSIS — E1165 Type 2 diabetes mellitus with hyperglycemia: Secondary | ICD-10-CM

## 2019-07-16 DIAGNOSIS — F17219 Nicotine dependence, cigarettes, with unspecified nicotine-induced disorders: Secondary | ICD-10-CM

## 2019-07-16 DIAGNOSIS — K219 Gastro-esophageal reflux disease without esophagitis: Secondary | ICD-10-CM

## 2019-07-16 DIAGNOSIS — I10 Essential (primary) hypertension: Secondary | ICD-10-CM

## 2019-07-16 DIAGNOSIS — G8929 Other chronic pain: Secondary | ICD-10-CM

## 2019-07-16 LAB — POCT GLYCOSYLATED HEMOGLOBIN (HGB A1C): Hemoglobin A1C: 8.4 % — AB (ref 4.0–5.6)

## 2019-07-16 MED ORDER — OXYCODONE-ACETAMINOPHEN 7.5-325 MG PO TABS: 1.0000 | ORAL_TABLET | Freq: Four times a day (QID) | ORAL | 0 refills | Status: DC | PRN

## 2019-07-16 MED ORDER — CANAGLIFLOZIN 100 MG PO TABS: 100.0000 mg | ORAL_TABLET | Freq: Every day | ORAL | 0 refills | Status: DC

## 2019-07-16 NOTE — Progress Notes (Signed)
[facility] [place], [place] [address]  Internal MEDICINE  Office Visit Note  Patient Name: Clinton Moore  [id number]  LP:[id number]  Date of Service: 07/16/2019  Chief Complaint  Patient presents with  . Medical Management of Chronic Issues    medication refills   . Diabetes    HPI  Pt is here for follow up on DM.  His A1C improved today from 9.4 to 8.4.  He is currently taking accupril, glimepiride and trulicity.  We once again discussed lifestyle modifications and factors that will help control his blood sugar.  He continues to struggle with chronic back pain, and is requesting refill on his pain medication at this time. His blood pressure is elevated today 134/99, and we discussed increased his medication.    Current Medication: Outpatient Encounter Medications as of 07/16/2019  Medication Sig  . amLODipine (NORVASC) 10 MG tablet Take 1 tablet (10 mg total) by mouth daily.  . cyclobenzaprine (FLEXERIL) 10 MG tablet Take 10 mg by mouth 3 (three) times daily as needed for spasms.  . Dulaglutide (TRULICITY) 1.5 0000000 SOPN INJECT 1.5 MG INTO THE SKIN ONCE A WEEK  . esomeprazole (NEXIUM) 40 MG capsule Take 1 cap po daily  . etodolac (LODINE) 400 MG tablet Take 1 tablet (400 mg total) by mouth 2 (two) times daily.  . fluticasone (FLONASE) 50 MCG/ACT nasal spray Place 1-2 sprays into both nostrils daily as needed. For allergies.  Marland Kitchen glimepiride (AMARYL) 4 MG tablet TAKE 1 TABLET WITH LARGEST MEAL OF THE DAY  . glucose blood (ACCU-CHEK GUIDE) test strip Use as instructed  . Lancets (ACCU-CHEK MULTICLIX) lancets Use as instructed  . Melatonin-Pyridoxine (MELATONIN/VITAMIN B-6 EX ST) 5-1 MG TABS Take 1 tablet by mouth at bedtime as needed. For sleep.  . meloxicam (MOBIC) 15 MG tablet TAKE 1 TABLET DAILY AS NEEDED FOR PAIN  . Multiple Vitamin (MULTIVITAMIN WITH MINERALS) TABS tablet Take 1 tablet by mouth daily. men multivitamin  . oxyCODONE-acetaminophen  (PERCOCET) 7.5-325 MG tablet Take 1 tablet by mouth every 6 (six) hours as needed for severe pain.  Marland Kitchen quinapril (ACCUPRIL) 10 MG tablet Take 1 tablet (10 mg total) by mouth daily.  . traMADol (ULTRAM) 50 MG tablet Take 1 tablet po BID prn pain  . zolpidem (AMBIEN) 10 MG tablet Take 1 tablet (10 mg total) by mouth at bedtime as needed for sleep.   No facility-administered encounter medications on file as of 07/16/2019.    Surgical History: Past Surgical History:  Procedure Laterality Date  . FOOT MASS EXCISION Right 2007  . PLANTAR FASCIA RELEASE Left 06/09/2016   Procedure: Radical excision plantar fibromas of left plantar arch and left hallux;  Surgeon: Sharlotte Alamo, DPM;  Location: [facility] ORS;  Service: Podiatry;  Laterality: Left;    Medical History: Past Medical History:  Diagnosis Date  . Anxiety   . Depression   . Diabetes mellitus without complication ([place])   . GERD (gastroesophageal reflux disease)   . Headache    history of migraines  . Hypertension     Family History: Family History  Problem Relation Age of Onset  . Diabetes Father   . Hypertension Father     Social History   Socioeconomic History  . Marital status: Married    Spouse name: Not on file  . Number of children: Not on file  . Years of education: Not on file  . Highest education level: Not on file  Occupational History  .  Not on file  Tobacco Use  . Smoking status: Former Smoker    Types: Cigarettes  . Smokeless tobacco: Never Used  . Tobacco comment: a few cigarettes per week  Substance and Sexual Activity  . Alcohol use: Yes    Alcohol/week: 3.0 standard drinks    Types: 3 Cans of beer per week  . Drug use: No  . Sexual activity: Never  Other Topics Concern  . Not on file  Social History Narrative  . Not on file   Social Determinants of Health   Financial Resource Strain:   . Difficulty of Paying Living Expenses: Not on file  Food Insecurity:   . Worried About Charity fundraiser  in the Last Year: Not on file  . Ran Out of Food in the Last Year: Not on file  Transportation Needs:   . Lack of Transportation (Medical): Not on file  . Lack of Transportation (Non-Medical): Not on file  Physical Activity:   . Days of Exercise per Week: Not on file  . Minutes of Exercise per Session: Not on file  Stress:   . Feeling of Stress : Not on file  Social Connections:   . Frequency of Communication with Friends and Family: Not on file  . Frequency of Social Gatherings with Friends and Family: Not on file  . Attends Religious Services: Not on file  . Active Member of Clubs or Organizations: Not on file  . Attends Archivist Meetings: Not on file  . Marital Status: Not on file  Intimate Partner Violence:   . Fear of Current or Ex-Partner: Not on file  . Emotionally Abused: Not on file  . Physically Abused: Not on file  . Sexually Abused: Not on file      Review of Systems  Constitutional: Negative.  Negative for chills, fatigue and unexpected weight change.  HENT: Negative.  Negative for congestion, rhinorrhea, sneezing and sore throat.   Eyes: Negative for redness.  Respiratory: Negative.  Negative for cough, chest tightness and shortness of breath.   Cardiovascular: Negative.  Negative for chest pain and palpitations.  Gastrointestinal: Negative.  Negative for abdominal pain, constipation, diarrhea, nausea and vomiting.  Endocrine: Negative.   Genitourinary: Negative.  Negative for dysuria and frequency.  Musculoskeletal: Negative.  Negative for arthralgias, back pain, joint swelling and neck pain.  Skin: Negative.  Negative for rash.  Allergic/Immunologic: Negative.   Neurological: Negative.  Negative for tremors and numbness.  Hematological: Negative for adenopathy. Does not bruise/bleed easily.  Psychiatric/Behavioral: Negative.  Negative for behavioral problems, sleep disturbance and suicidal ideas. The patient is not nervous/anxious.     Vital  Signs: BP (!) 134/99   Pulse 88   Resp 16   Ht 6\' 3"  (1.905 m)   Wt 246 lb (111.6 kg)   SpO2 97%   BMI 30.75 kg/m    Physical Exam Vitals and nursing note reviewed.  Constitutional:      General: He is not in acute distress.    Appearance: He is well-developed. He is not diaphoretic.  HENT:     Head: Normocephalic and atraumatic.     Mouth/Throat:     Pharynx: No oropharyngeal exudate.  Eyes:     Pupils: Pupils are equal, round, and reactive to light.  Neck:     Thyroid: No thyromegaly.     Vascular: No JVD.     Trachea: No tracheal deviation.  Cardiovascular:     Rate and Rhythm: Normal rate and  regular rhythm.     Heart sounds: Normal heart sounds. No murmur. No friction rub. No gallop.   Pulmonary:     Effort: Pulmonary effort is normal. No respiratory distress.     Breath sounds: Normal breath sounds. No wheezing or rales.  Chest:     Chest wall: No tenderness.  Abdominal:     Palpations: Abdomen is soft.     Tenderness: There is no abdominal tenderness. There is no guarding.  Musculoskeletal:        General: Normal range of motion.     Cervical back: Normal range of motion and neck supple.  Lymphadenopathy:     Cervical: No cervical adenopathy.  Skin:    General: Skin is warm and dry.  Neurological:     Mental Status: He is alert and oriented to person, place, and time.     Cranial Nerves: No cranial nerve deficit.  Psychiatric:        Behavior: Behavior normal.        Thought Content: Thought content normal.        Judgment: Judgment normal.    Assessment/Plan: 1. Uncontrolled type 2 diabetes mellitus with hyperglycemia (HCC) Improving, down to 8.4 Will add Inivokana to regimen, and recheck A1C in 3 months.  - POCT HgB A1C  2. Chronic back pain greater than 3 months duration Last refill of Percocet was in 08/2018 Reviewed risks and possible side effects associated with taking opiates, benzodiazepines and other CNS depressants. Combination of these  could cause dizziness and drowsiness. Advised patient not to drive or operate machinery when taking these medications, as patient's and other's life can be at risk and will have consequences. Patient verbalized understanding in this matter. Dependence and abuse for these drugs will be monitored closely. A Controlled substance policy and procedure is on file which allows [facility] to order a urine drug screen test at any visit. Patient understands and agrees with the plan - oxyCODONE-acetaminophen (PERCOCET) 7.5-325 MG tablet; Take 1 tablet by mouth every 6 (six) hours as needed for severe pain.  Dispense: 30 tablet; Refill: 0  3. Essential hypertension Increase accupril to 20mg  daily and follow up in 3 weeks for bp check.   4. Gastroesophageal reflux disease without esophagitis Controlled, continue present management.   5. Cigarette nicotine dependence with nicotine-induced disorder Smoking cessation counseling: 1. Pt acknowledges the risks of long term smoking, she will try to quite smoking. 2. Options for different medications including nicotine products, chewing gum, patch etc, Wellbutrin and Chantix is discussed 3. Goal and date of compete cessation is discussed 4. Total time spent in smoking cessation is 15 min.   General Counseling: willmer hilton understanding of the findings of todays visit and agrees with plan of treatment. I have discussed any further diagnostic evaluation that may be needed or ordered today. We also reviewed his medications today. he has been encouraged to call the office with any questions or concerns that should arise related to todays visit.    Orders Placed This Encounter  Procedures  . POCT HgB A1C    No orders of the defined types were placed in this encounter.   Time spent: 25 Minutes   This patient was seen by Orson Gear AGNP-C in Collaboration with Dr Lavera Guise as a part of collaborative care agreement     Kendell Bane AGNP-C Internal medicine

## 2019-08-05 ENCOUNTER — Telehealth: Payer: Self-pay

## 2019-08-05 NOTE — Telephone Encounter (Signed)
Called lmom informing patient of appointment. klh 

## 2019-08-07 ENCOUNTER — Other Ambulatory Visit: Payer: Self-pay

## 2019-08-07 ENCOUNTER — Ambulatory Visit: Payer: BC Managed Care – PPO | Admitting: Adult Health

## 2019-08-07 ENCOUNTER — Encounter: Payer: Self-pay | Admitting: Adult Health

## 2019-08-07 VITALS — BP 131/89 | HR 94 | Temp 97.5°F | Resp 16 | Ht 75.0 in | Wt 241.0 lb

## 2019-08-07 DIAGNOSIS — E1165 Type 2 diabetes mellitus with hyperglycemia: Secondary | ICD-10-CM

## 2019-08-07 DIAGNOSIS — M549 Dorsalgia, unspecified: Secondary | ICD-10-CM

## 2019-08-07 DIAGNOSIS — I10 Essential (primary) hypertension: Secondary | ICD-10-CM

## 2019-08-07 DIAGNOSIS — G8929 Other chronic pain: Secondary | ICD-10-CM | POA: Diagnosis not present

## 2019-08-07 NOTE — Progress Notes (Signed)
[facility] [place], [place] [address]  Internal MEDICINE  Office Visit Note  Patient Name: Clinton Moore  [id number]  LP:[id number]  Date of Service: 08/07/2019  Chief Complaint  Patient presents with  . Depression  . Diabetes  . Follow-up    BP    HPI Patient is here today for routine follow up on his blood pressure. Blood pressure today is much improved with the increase in his accupril at his last visit. Denies chest pain, palpitations or headache. Chronic back pain is stable at this time, continues with non-pharmacological and pharmacological management. Blood sugar has been improving over the last few months, continues to focus on his diet and exercise. Will continue to monitor.  Current Medication: Outpatient Encounter Medications as of 08/07/2019  Medication Sig  . amLODipine (NORVASC) 10 MG tablet Take 1 tablet (10 mg total) by mouth daily.  . canagliflozin (INVOKANA) 100 MG TABS tablet Take 1 tablet (100 mg total) by mouth daily before breakfast.  . cyclobenzaprine (FLEXERIL) 10 MG tablet Take 10 mg by mouth 3 (three) times daily as needed for spasms.  . Dulaglutide (TRULICITY) 1.5 0000000 SOPN INJECT 1.5 MG INTO THE SKIN ONCE A WEEK  . esomeprazole (NEXIUM) 40 MG capsule Take 1 cap po daily  . etodolac (LODINE) 400 MG tablet Take 1 tablet (400 mg total) by mouth 2 (two) times daily.  . fluticasone (FLONASE) 50 MCG/ACT nasal spray Place 1-2 sprays into both nostrils daily as needed. For allergies.  Marland Kitchen glimepiride (AMARYL) 4 MG tablet TAKE 1 TABLET WITH LARGEST MEAL OF THE DAY  . glucose blood (ACCU-CHEK GUIDE) test strip Use as instructed  . Lancets (ACCU-CHEK MULTICLIX) lancets Use as instructed  . Melatonin-Pyridoxine (MELATONIN/VITAMIN B-6 EX ST) 5-1 MG TABS Take 1 tablet by mouth at bedtime as needed. For sleep.  . meloxicam (MOBIC) 15 MG tablet TAKE 1 TABLET DAILY AS NEEDED FOR PAIN  . Multiple Vitamin (MULTIVITAMIN WITH MINERALS) TABS  tablet Take 1 tablet by mouth daily. men multivitamin  . oxyCODONE-acetaminophen (PERCOCET) 7.5-325 MG tablet Take 1 tablet by mouth every 6 (six) hours as needed for severe pain.  Marland Kitchen quinapril (ACCUPRIL) 10 MG tablet Take 1 tablet (10 mg total) by mouth daily.  . traMADol (ULTRAM) 50 MG tablet Take 1 tablet po BID prn pain  . zolpidem (AMBIEN) 10 MG tablet Take 1 tablet (10 mg total) by mouth at bedtime as needed for sleep.   No facility-administered encounter medications on file as of 08/07/2019.    Surgical History: Past Surgical History:  Procedure Laterality Date  . FOOT MASS EXCISION Right 2007  . PLANTAR FASCIA RELEASE Left 06/09/2016   Procedure: Radical excision plantar fibromas of left plantar arch and left hallux;  Surgeon: Sharlotte Alamo, DPM;  Location: [facility] ORS;  Service: Podiatry;  Laterality: Left;    Medical History: Past Medical History:  Diagnosis Date  . Anxiety   . Depression   . Diabetes mellitus without complication ([place])   . GERD (gastroesophageal reflux disease)   . Headache    history of migraines  . Hypertension     Family History: Family History  Problem Relation Age of Onset  . Diabetes Father   . Hypertension Father     Social History   Socioeconomic History  . Marital status: Married    Spouse name: Not on file  . Number of children: Not on file  . Years of education: Not on file  . Highest education level:  Not on file  Occupational History  . Not on file  Tobacco Use  . Smoking status: Former Smoker    Types: Cigarettes  . Smokeless tobacco: Never Used  . Tobacco comment: a few cigarettes per week  Substance and Sexual Activity  . Alcohol use: Yes    Alcohol/week: 3.0 standard drinks    Types: 3 Cans of beer per week  . Drug use: No  . Sexual activity: Never  Other Topics Concern  . Not on file  Social History Narrative  . Not on file   Social Determinants of Health   Financial Resource Strain:   . Difficulty of Paying Living  Expenses: Not on file  Food Insecurity:   . Worried About Charity fundraiser in the Last Year: Not on file  . Ran Out of Food in the Last Year: Not on file  Transportation Needs:   . Lack of Transportation (Medical): Not on file  . Lack of Transportation (Non-Medical): Not on file  Physical Activity:   . Days of Exercise per Week: Not on file  . Minutes of Exercise per Session: Not on file  Stress:   . Feeling of Stress : Not on file  Social Connections:   . Frequency of Communication with Friends and Family: Not on file  . Frequency of Social Gatherings with Friends and Family: Not on file  . Attends Religious Services: Not on file  . Active Member of Clubs or Organizations: Not on file  . Attends Archivist Meetings: Not on file  . Marital Status: Not on file  Intimate Partner Violence:   . Fear of Current or Ex-Partner: Not on file  . Emotionally Abused: Not on file  . Physically Abused: Not on file  . Sexually Abused: Not on file    Review of Systems  Constitutional: Negative.  Negative for chills, fatigue and unexpected weight change.  HENT: Negative.  Negative for congestion, rhinorrhea, sneezing and sore throat.   Eyes: Negative for redness.  Respiratory: Negative.  Negative for cough, chest tightness and shortness of breath.   Cardiovascular: Negative.  Negative for chest pain and palpitations.  Gastrointestinal: Negative.  Negative for abdominal pain, constipation, diarrhea, nausea and vomiting.  Endocrine: Negative.   Genitourinary: Negative.  Negative for dysuria and frequency.  Musculoskeletal: Negative.  Negative for arthralgias, back pain, joint swelling and neck pain.  Skin: Negative.  Negative for rash.  Allergic/Immunologic: Negative.   Neurological: Negative.  Negative for tremors and numbness.  Hematological: Negative for adenopathy. Does not bruise/bleed easily.  Psychiatric/Behavioral: Negative.  Negative for behavioral problems, sleep  disturbance and suicidal ideas. The patient is not nervous/anxious.     Vital Signs: BP 131/89   Pulse 94   Temp (!) 97.5 F (36.4 C)   Resp 16   Ht 6\' 3"  (1.905 m)   Wt 241 lb (109.3 kg)   SpO2 95%   BMI 30.12 kg/m    Physical Exam Vitals and nursing note reviewed.  Constitutional:      General: He is not in acute distress.    Appearance: He is well-developed. He is not diaphoretic.  HENT:     Head: Normocephalic and atraumatic.     Mouth/Throat:     Pharynx: No oropharyngeal exudate.  Eyes:     Pupils: Pupils are equal, round, and reactive to light.  Neck:     Thyroid: No thyromegaly.     Vascular: No JVD.     Trachea: No tracheal  deviation.  Cardiovascular:     Rate and Rhythm: Normal rate and regular rhythm.     Heart sounds: Normal heart sounds. No murmur. No friction rub. No gallop.   Pulmonary:     Effort: Pulmonary effort is normal. No respiratory distress.     Breath sounds: Normal breath sounds. No wheezing or rales.  Chest:     Chest wall: No tenderness.  Abdominal:     Palpations: Abdomen is soft.  Musculoskeletal:        General: Normal range of motion.     Cervical back: Normal range of motion and neck supple.  Lymphadenopathy:     Cervical: No cervical adenopathy.  Skin:    General: Skin is warm and dry.  Neurological:     Mental Status: He is alert and oriented to person, place, and time.     Cranial Nerves: No cranial nerve deficit.  Psychiatric:        Behavior: Behavior normal.        Thought Content: Thought content normal.        Judgment: Judgment normal.    Assessment/Plan: 1. Essential (primary) hypertension Increased dose of 20 mg accupril has improved blood pressure. Continue on this dose at this time, continue to monitor.  2. Type 2 diabetes mellitus with hyperglycemia, unspecified whether long term insulin use (HCC) Stable at this time, continue to monitor.  3. Chronic back pain greater than 3 months duration Stable at this  time, continue to monitor.   General Counseling: Clinton Moore understanding of the findings of todays visit and agrees with plan of treatment. I have discussed any further diagnostic evaluation that may be needed or ordered today. We also reviewed his medications today. he has been encouraged to call the office with any questions or concerns that should arise related to todays visit.    No orders of the defined types were placed in this encounter.   No orders of the defined types were placed in this encounter.   Time spent: 25 Minutes   This patient was seen by Orson Gear AGNP-C in Collaboration with Dr Lavera Guise as a part of collaborative care agreement     Kendell Bane AGNP-C Internal medicine

## 2019-09-22 ENCOUNTER — Other Ambulatory Visit: Payer: Self-pay

## 2019-09-22 ENCOUNTER — Other Ambulatory Visit: Payer: Self-pay | Admitting: Adult Health

## 2019-09-22 DIAGNOSIS — E1165 Type 2 diabetes mellitus with hyperglycemia: Secondary | ICD-10-CM

## 2019-09-22 MED ORDER — QUINAPRIL HCL 20 MG PO TABS: 20.0000 mg | ORAL_TABLET | Freq: Every day | ORAL | 1 refills | Status: DC

## 2019-09-22 MED ORDER — ESOMEPRAZOLE MAGNESIUM 40 MG PO CPDR: DELAYED_RELEASE_CAPSULE | ORAL | 1 refills | Status: DC

## 2019-09-22 MED ORDER — AMLODIPINE BESYLATE 10 MG PO TABS: 10.0000 mg | ORAL_TABLET | Freq: Every day | ORAL | 1 refills | Status: DC

## 2019-09-22 NOTE — Telephone Encounter (Signed)
As per adam change quinapril 20 mg daily and d/c quinapril 10 mg

## 2019-10-10 ENCOUNTER — Telehealth: Payer: Self-pay

## 2019-10-10 NOTE — Telephone Encounter (Signed)
Called lmom informing patient of appointment on 10/14/2019. klh 

## 2019-10-10 NOTE — Telephone Encounter (Signed)
Patient rescheduled appointment on 10/14/2019 to 12/08/2019. klh

## 2019-10-14 ENCOUNTER — Ambulatory Visit: Payer: BC Managed Care – PPO | Admitting: Adult Health

## 2019-10-20 ENCOUNTER — Other Ambulatory Visit: Payer: Self-pay

## 2019-10-20 MED ORDER — MELOXICAM 15 MG PO TABS: ORAL_TABLET | ORAL | 1 refills | Status: DC

## 2019-10-29 ENCOUNTER — Other Ambulatory Visit: Payer: Self-pay

## 2019-10-29 MED ORDER — CYCLOBENZAPRINE HCL 10 MG PO TABS: 10.0000 mg | ORAL_TABLET | Freq: Three times a day (TID) | ORAL | 1 refills | Status: DC | PRN

## 2019-11-13 ENCOUNTER — Other Ambulatory Visit: Payer: Self-pay | Admitting: Internal Medicine

## 2019-11-13 DIAGNOSIS — E1165 Type 2 diabetes mellitus with hyperglycemia: Secondary | ICD-10-CM

## 2019-11-19 ENCOUNTER — Other Ambulatory Visit: Payer: Self-pay | Admitting: Adult Health

## 2019-11-19 ENCOUNTER — Telehealth: Payer: Self-pay

## 2019-11-19 NOTE — Telephone Encounter (Signed)
lmom to call us back regarding refills

## 2019-11-19 NOTE — Telephone Encounter (Signed)
PT ADVISED THAT WE REFILLS INVOKANA FOR 30 DAYS DUE TO PT APPT IS COMING NEXT MONTHS WE CAN REFILLS AFTER WE RECHECK A1C INCASE WE NEED CHANGE MED

## 2019-12-04 ENCOUNTER — Telehealth: Payer: Self-pay

## 2019-12-04 NOTE — Telephone Encounter (Signed)
Called lmom informing patient of appointment on 12/08/2019. klh

## 2019-12-08 ENCOUNTER — Ambulatory Visit: Payer: BC Managed Care – PPO | Admitting: Adult Health

## 2019-12-08 ENCOUNTER — Telehealth: Payer: Self-pay

## 2019-12-08 ENCOUNTER — Encounter: Payer: Self-pay | Admitting: Adult Health

## 2019-12-08 ENCOUNTER — Other Ambulatory Visit: Payer: Self-pay

## 2019-12-08 VITALS — BP 138/82 | HR 89 | Temp 97.4°F | Resp 16 | Ht 75.0 in | Wt 228.0 lb

## 2019-12-08 DIAGNOSIS — G4700 Insomnia, unspecified: Secondary | ICD-10-CM

## 2019-12-08 DIAGNOSIS — I10 Essential (primary) hypertension: Secondary | ICD-10-CM

## 2019-12-08 DIAGNOSIS — G8929 Other chronic pain: Secondary | ICD-10-CM

## 2019-12-08 DIAGNOSIS — E1165 Type 2 diabetes mellitus with hyperglycemia: Secondary | ICD-10-CM | POA: Diagnosis not present

## 2019-12-08 DIAGNOSIS — F17219 Nicotine dependence, cigarettes, with unspecified nicotine-induced disorders: Secondary | ICD-10-CM | POA: Diagnosis not present

## 2019-12-08 DIAGNOSIS — K219 Gastro-esophageal reflux disease without esophagitis: Secondary | ICD-10-CM

## 2019-12-08 DIAGNOSIS — M549 Dorsalgia, unspecified: Secondary | ICD-10-CM

## 2019-12-08 DIAGNOSIS — M722 Plantar fascial fibromatosis: Secondary | ICD-10-CM

## 2019-12-08 LAB — POCT GLYCOSYLATED HEMOGLOBIN (HGB A1C): Hemoglobin A1C: 6.7 % — AB (ref 4.0–5.6)

## 2019-12-08 MED ORDER — CANAGLIFLOZIN 300 MG PO TABS: 300.0000 mg | ORAL_TABLET | Freq: Every day | ORAL | 1 refills | Status: DC

## 2019-12-08 MED ORDER — ZOLPIDEM TARTRATE 10 MG PO TABS: 10.0000 mg | ORAL_TABLET | Freq: Every evening | ORAL | 1 refills | Status: DC | PRN

## 2019-12-08 MED ORDER — TRAMADOL HCL 50 MG PO TABS: ORAL_TABLET | ORAL | 1 refills | Status: DC

## 2019-12-08 NOTE — Progress Notes (Signed)
[facility] [place], [place] [address]  Internal MEDICINE  Office Visit Note  Patient Name: Clinton Moore  [id number]  LP:[id number]  Date of Service: 12/08/2019  Chief Complaint  Patient presents with  . Hypertension  . Diabetes  . Gastroesophageal Reflux  . Anxiety  . Numbness    in right thumb     HPI  PT is here for follow up on HTN, DM, Insomnia, anxiety and GERd. He is overall doing well.  His blood sugars are well controlled.  His A1C is 6.7 today.  He has been doing well with his diet.  He denies any low blood sugars.  He denies any issues at this time.   Right thumb numbness.  It has been present for 2-3 months.  Denies any injury or trauma.  He reports it has not gotten worse or better over the last 3 months.  He does repetitive motions with his hands while working.   Current Medication: Outpatient Encounter Medications as of 12/08/2019  Medication Sig  . amLODipine (NORVASC) 10 MG tablet Take 1 tablet (10 mg total) by mouth daily.  . cyclobenzaprine (FLEXERIL) 10 MG tablet Take 1 tablet (10 mg total) by mouth 3 (three) times daily as needed.  . Dulaglutide (TRULICITY) 1.5 0000000 SOPN INJECT 1.5 MG UNDER THE SKIN ONCE A WEEK  . esomeprazole (NEXIUM) 40 MG capsule Take 1 cap po daily  . etodolac (LODINE) 400 MG tablet Take 1 tablet (400 mg total) by mouth 2 (two) times daily.  . fluticasone (FLONASE) 50 MCG/ACT nasal spray Place 1-2 sprays into both nostrils daily as needed. For allergies.  Marland Kitchen glimepiride (AMARYL) 4 MG tablet TAKE 1 TABLET WITH LARGEST MEAL OF THE DAY  . glucose blood (ACCU-CHEK GUIDE) test strip Use as instructed  . Lancets (ACCU-CHEK MULTICLIX) lancets Use as instructed  . Melatonin-Pyridoxine (MELATONIN/VITAMIN B-6 EX ST) 5-1 MG TABS Take 1 tablet by mouth at bedtime as needed. For sleep.  . meloxicam (MOBIC) 15 MG tablet TAKE 1 TABLET DAILY AS NEEDED FOR PAIN  . Multiple Vitamin (MULTIVITAMIN WITH MINERALS) TABS tablet  Take 1 tablet by mouth daily. men multivitamin  . oxyCODONE-acetaminophen (PERCOCET) 7.5-325 MG tablet Take 1 tablet by mouth every 6 (six) hours as needed for severe pain.  Marland Kitchen quinapril (ACCUPRIL) 20 MG tablet Take 1 tablet (20 mg total) by mouth daily.  . traMADol (ULTRAM) 50 MG tablet Take 1 tablet po BID prn pain  . [DISCONTINUED] INVOKANA 100 MG TABS tablet TAKE 1 TABLET DAILY BEFORE BREAKFAST, TAKE 30 MINUTES BEFORE FIRST MEAL  . [DISCONTINUED] traMADol (ULTRAM) 50 MG tablet Take 1 tablet po BID prn pain  . canagliflozin (INVOKANA) 300 MG TABS tablet Take 1 tablet (300 mg total) by mouth daily before breakfast.  . zolpidem (AMBIEN) 10 MG tablet Take 1 tablet (10 mg total) by mouth at bedtime as needed for sleep.  . [DISCONTINUED] zolpidem (AMBIEN) 10 MG tablet Take 1 tablet (10 mg total) by mouth at bedtime as needed for sleep.   No facility-administered encounter medications on file as of 12/08/2019.    Surgical History: Past Surgical History:  Procedure Laterality Date  . FOOT MASS EXCISION Right 2007  . PLANTAR FASCIA RELEASE Left 06/09/2016   Procedure: Radical excision plantar fibromas of left plantar arch and left hallux;  Surgeon: Sharlotte Alamo, DPM;  Location: [facility] ORS;  Service: Podiatry;  Laterality: Left;    Medical History: Past Medical History:  Diagnosis Date  . Anxiety   .  Depression   . Diabetes mellitus without complication ([place])   . GERD (gastroesophageal reflux disease)   . Headache    history of migraines  . Hypertension     Family History: Family History  Problem Relation Age of Onset  . Diabetes Father   . Hypertension Father     Social History   Socioeconomic History  . Marital status: Married    Spouse name: Not on file  . Number of children: Not on file  . Years of education: Not on file  . Highest education level: Not on file  Occupational History  . Not on file  Tobacco Use  . Smoking status: Current Some Day Smoker    Types: Cigarettes   . Smokeless tobacco: Never Used  . Tobacco comment: social smoker   Substance and Sexual Activity  . Alcohol use: Yes    Alcohol/week: 3.0 standard drinks    Types: 3 Cans of beer per week  . Drug use: No  . Sexual activity: Never  Other Topics Concern  . Not on file  Social History Narrative  . Not on file   Social Determinants of Health   Financial Resource Strain:   . Difficulty of Paying Living Expenses:   Food Insecurity:   . Worried About Charity fundraiser in the Last Year:   . Arboriculturist in the Last Year:   Transportation Needs:   . Film/video editor (Medical):   Marland Kitchen Lack of Transportation (Non-Medical):   Physical Activity:   . Days of Exercise per Week:   . Minutes of Exercise per Session:   Stress:   . Feeling of Stress :   Social Connections:   . Frequency of Communication with Friends and Family:   . Frequency of Social Gatherings with Friends and Family:   . Attends Religious Services:   . Active Member of Clubs or Organizations:   . Attends Archivist Meetings:   Marland Kitchen Marital Status:   Intimate Partner Violence:   . Fear of Current or Ex-Partner:   . Emotionally Abused:   Marland Kitchen Physically Abused:   . Sexually Abused:       Review of Systems  Constitutional: Negative.  Negative for chills, fatigue and unexpected weight change.  HENT: Negative.  Negative for congestion, rhinorrhea, sneezing and sore throat.   Eyes: Negative for redness.  Respiratory: Negative.  Negative for cough, chest tightness and shortness of breath.   Cardiovascular: Negative.  Negative for chest pain and palpitations.  Gastrointestinal: Negative.  Negative for abdominal pain, constipation, diarrhea, nausea and vomiting.  Endocrine: Negative.   Genitourinary: Negative.  Negative for dysuria and frequency.  Musculoskeletal: Negative.  Negative for arthralgias, back pain, joint swelling and neck pain.  Skin: Negative.  Negative for rash.  Allergic/Immunologic:  Negative.   Neurological: Negative.  Negative for tremors and numbness.  Hematological: Negative for adenopathy. Does not bruise/bleed easily.  Psychiatric/Behavioral: Negative.  Negative for behavioral problems, sleep disturbance and suicidal ideas. The patient is not nervous/anxious.     Vital Signs: BP 138/82   Pulse 89   Temp (!) 97.4 F (36.3 C)   Resp 16   Ht 6\' 3"  (1.905 m)   Wt 228 lb (103.4 kg)   SpO2 99%   BMI 28.50 kg/m    Physical Exam Vitals and nursing note reviewed.  Constitutional:      General: He is not in acute distress.    Appearance: He is well-developed. He is not  diaphoretic.  HENT:     Head: Normocephalic and atraumatic.     Mouth/Throat:     Pharynx: No oropharyngeal exudate.  Eyes:     Pupils: Pupils are equal, round, and reactive to light.  Neck:     Thyroid: No thyromegaly.     Vascular: No JVD.     Trachea: No tracheal deviation.  Cardiovascular:     Rate and Rhythm: Normal rate and regular rhythm.     Heart sounds: Normal heart sounds. No murmur. No friction rub. No gallop.   Pulmonary:     Effort: Pulmonary effort is normal. No respiratory distress.     Breath sounds: Normal breath sounds. No wheezing or rales.  Chest:     Chest wall: No tenderness.  Abdominal:     Palpations: Abdomen is soft.     Tenderness: There is no abdominal tenderness. There is no guarding.  Musculoskeletal:        General: Normal range of motion.     Cervical back: Normal range of motion and neck supple.  Lymphadenopathy:     Cervical: No cervical adenopathy.  Skin:    General: Skin is warm and dry.  Neurological:     Mental Status: He is alert and oriented to person, place, and time.     Cranial Nerves: No cranial nerve deficit.  Psychiatric:        Behavior: Behavior normal.        Thought Content: Thought content normal.        Judgment: Judgment normal.    Assessment/Plan: 1. Type 2 diabetes mellitus with hyperglycemia, unspecified whether long  term insulin use (HCC) Discussed increasing Invokana to 300mg .  His A1C is much improved.  He will half his Amaryl to 2mg  if his blood glucose becomes low.   - POCT HgB A1C - canagliflozin (INVOKANA) 300 MG TABS tablet; Take 1 tablet (300 mg total) by mouth daily before breakfast.  Dispense: 90 tablet; Refill: 1  2. Gastroesophageal reflux disease without esophagitis Stable, continue otc med management.   3. Insomnia, unspecified type Reviewed risks and possible side effects associated with taking opiates, benzodiazepines and other CNS depressants. Combination of these could cause dizziness and drowsiness. Advised patient not to drive or operate machinery when taking these medications, as patient's and other's life can be at risk and will have consequences. Patient verbalized understanding in this matter. Dependence and abuse for these drugs will be monitored closely. A Controlled substance policy and procedure is on file which allows [facility] to order a urine drug screen test at any visit. Patient understands and agrees with the plan - zolpidem (AMBIEN) 10 MG tablet; Take 1 tablet (10 mg total) by mouth at bedtime as needed for sleep.  Dispense: 90 tablet; Refill: 1  4. Essential (primary) hypertension Stable, continue present management.   5. Plantar fascial fibromatosis Reviewed risks and possible side effects associated with taking opiates, benzodiazepines and other CNS depressants. Combination of these could cause dizziness and drowsiness. Advised patient not to drive or operate machinery when taking these medications, as patient's and other's life can be at risk and will have consequences. Patient verbalized understanding in this matter. Dependence and abuse for these drugs will be monitored closely. A Controlled substance policy and procedure is on file which allows [facility] to order a urine drug screen test at any visit. Patient understands and agrees with the  plan - traMADol (ULTRAM) 50 MG tablet; Take 1 tablet po BID  prn pain  Dispense: 180 tablet; Refill: 1  6. Chronic back pain greater than 3 months duration Continue current management.   7. Cigarette nicotine dependence with nicotine-induced disorder Smoking cessation counseling: 1. Pt acknowledges the risks of long term smoking, she will try to quite smoking. 2. Options for different medications including nicotine products, chewing gum, patch etc, Wellbutrin and Chantix is discussed 3. Goal and date of compete cessation is discussed 4. Total time spent in smoking cessation is 15 min.   General Counseling: coddy mansir understanding of the findings of todays visit and agrees with plan of treatment. I have discussed any further diagnostic evaluation that may be needed or ordered today. We also reviewed his medications today. he has been encouraged to call the office with any questions or concerns that should arise related to todays visit.    Orders Placed This Encounter  Procedures  . POCT HgB A1C    Meds ordered this encounter  Medications  . canagliflozin (INVOKANA) 300 MG TABS tablet    Sig: Take 1 tablet (300 mg total) by mouth daily before breakfast.    Dispense:  90 tablet    Refill:  1  . zolpidem (AMBIEN) 10 MG tablet    Sig: Take 1 tablet (10 mg total) by mouth at bedtime as needed for sleep.    Dispense:  90 tablet    Refill:  1  . traMADol (ULTRAM) 50 MG tablet    Sig: Take 1 tablet po BID prn pain    Dispense:  180 tablet    Refill:  1    Time spent: 30 Minutes   This patient was seen by Orson Gear AGNP-C in Collaboration with Dr Lavera Guise as a part of collaborative care agreement     Kendell Bane AGNP-C Internal medicine

## 2019-12-08 NOTE — Telephone Encounter (Signed)
Confirmed appointment on 12/08/2019 and screened for covid. klh

## 2019-12-25 ENCOUNTER — Other Ambulatory Visit: Payer: Self-pay

## 2019-12-25 MED ORDER — CYCLOBENZAPRINE HCL 10 MG PO TABS: 10.0000 mg | ORAL_TABLET | Freq: Three times a day (TID) | ORAL | 0 refills | Status: DC | PRN

## 2019-12-25 NOTE — Telephone Encounter (Signed)
Pt called need flexeril to send express script 90 tab pt take as needed I send 90 tab

## 2020-01-01 ENCOUNTER — Other Ambulatory Visit: Payer: Self-pay

## 2020-01-01 MED ORDER — QUINAPRIL HCL 20 MG PO TABS: 20.0000 mg | ORAL_TABLET | Freq: Every day | ORAL | 0 refills | Status: DC

## 2020-01-01 MED ORDER — ESOMEPRAZOLE MAGNESIUM 40 MG PO CPDR: DELAYED_RELEASE_CAPSULE | ORAL | 1 refills | Status: DC

## 2020-01-01 MED ORDER — QUINAPRIL HCL 20 MG PO TABS: 20.0000 mg | ORAL_TABLET | Freq: Every day | ORAL | 1 refills | Status: DC

## 2020-02-10 ENCOUNTER — Other Ambulatory Visit: Payer: Self-pay

## 2020-02-10 ENCOUNTER — Telehealth: Payer: Self-pay

## 2020-02-10 DIAGNOSIS — E1165 Type 2 diabetes mellitus with hyperglycemia: Secondary | ICD-10-CM

## 2020-02-10 MED ORDER — TRULICITY 1.5 MG/0.5ML ~~LOC~~ SOAJ: SUBCUTANEOUS | 1 refills | Status: DC

## 2020-02-10 NOTE — Telephone Encounter (Signed)
Left a message asking pt to call back and ask for more information regarding his message about trulicity. Beth

## 2020-03-08 ENCOUNTER — Ambulatory Visit: Payer: BC Managed Care – PPO | Admitting: Adult Health

## 2020-03-28 ENCOUNTER — Other Ambulatory Visit: Payer: Self-pay | Admitting: Adult Health

## 2020-03-28 DIAGNOSIS — E1165 Type 2 diabetes mellitus with hyperglycemia: Secondary | ICD-10-CM

## 2020-03-29 ENCOUNTER — Other Ambulatory Visit: Payer: Self-pay | Admitting: Internal Medicine

## 2020-03-29 DIAGNOSIS — G4700 Insomnia, unspecified: Secondary | ICD-10-CM

## 2020-03-29 DIAGNOSIS — Z125 Encounter for screening for malignant neoplasm of prostate: Secondary | ICD-10-CM

## 2020-03-29 DIAGNOSIS — E1165 Type 2 diabetes mellitus with hyperglycemia: Secondary | ICD-10-CM

## 2020-03-29 DIAGNOSIS — K219 Gastro-esophageal reflux disease without esophagitis: Secondary | ICD-10-CM

## 2020-03-29 DIAGNOSIS — I10 Essential (primary) hypertension: Secondary | ICD-10-CM

## 2020-04-19 ENCOUNTER — Ambulatory Visit: Payer: BC Managed Care – PPO | Admitting: Adult Health

## 2020-05-03 ENCOUNTER — Ambulatory Visit (INDEPENDENT_AMBULATORY_CARE_PROVIDER_SITE_OTHER): Payer: BC Managed Care – PPO | Admitting: Adult Health

## 2020-05-03 ENCOUNTER — Other Ambulatory Visit: Payer: Self-pay

## 2020-05-03 ENCOUNTER — Encounter: Payer: Self-pay | Admitting: Adult Health

## 2020-05-03 VITALS — BP 130/100 | HR 103 | Temp 97.7°F | Resp 16 | Ht 75.0 in | Wt 233.4 lb

## 2020-05-03 DIAGNOSIS — M549 Dorsalgia, unspecified: Secondary | ICD-10-CM | POA: Diagnosis not present

## 2020-05-03 DIAGNOSIS — Z79899 Other long term (current) drug therapy: Secondary | ICD-10-CM

## 2020-05-03 DIAGNOSIS — G8929 Other chronic pain: Secondary | ICD-10-CM

## 2020-05-03 DIAGNOSIS — I10 Essential (primary) hypertension: Secondary | ICD-10-CM

## 2020-05-03 DIAGNOSIS — Z125 Encounter for screening for malignant neoplasm of prostate: Secondary | ICD-10-CM

## 2020-05-03 DIAGNOSIS — Z0001 Encounter for general adult medical examination with abnormal findings: Secondary | ICD-10-CM

## 2020-05-03 DIAGNOSIS — E1165 Type 2 diabetes mellitus with hyperglycemia: Secondary | ICD-10-CM | POA: Diagnosis not present

## 2020-05-03 DIAGNOSIS — R300 Dysuria: Secondary | ICD-10-CM

## 2020-05-03 DIAGNOSIS — T7840XD Allergy, unspecified, subsequent encounter: Secondary | ICD-10-CM

## 2020-05-03 LAB — POCT GLYCOSYLATED HEMOGLOBIN (HGB A1C): Hemoglobin A1C: 6.5 % — AB (ref 4.0–5.6)

## 2020-05-03 MED ORDER — GLIMEPIRIDE 4 MG PO TABS: ORAL_TABLET | ORAL | 1 refills | Status: DC

## 2020-05-03 MED ORDER — CYCLOBENZAPRINE HCL 10 MG PO TABS: 10.0000 mg | ORAL_TABLET | Freq: Three times a day (TID) | ORAL | 0 refills | Status: DC | PRN

## 2020-05-03 MED ORDER — QUINAPRIL HCL 20 MG PO TABS: 20.0000 mg | ORAL_TABLET | Freq: Every day | ORAL | 1 refills | Status: DC

## 2020-05-03 MED ORDER — MONTELUKAST SODIUM 10 MG PO TABS: 10.0000 mg | ORAL_TABLET | Freq: Every day | ORAL | 1 refills | Status: DC

## 2020-05-03 NOTE — Progress Notes (Signed)
[facility] [place], [place] [address]  Internal MEDICINE  Office Visit Note  Patient Name: Clinton Moore  [id number]  [id number]  Date of Service: 05/03/2020  Chief Complaint  Patient presents with  . Annual Exam  . Depression  . Diabetes  . Hypertension     HPI Pt is here for routine health maintenance examination. He is a well appearing 54 yo AA male.  He reports his blood pressure has been high recently and he has been out of his accupril.  He has been taking his amlodipine. However, his bp is elevated today. His DM is well controlled with Invokana.  His A1C today is 6.5.       Current Medication: Outpatient Encounter Medications as of 05/03/2020  Medication Sig  . amLODipine (NORVASC) 10 MG tablet Take 1 tablet (10 mg total) by mouth daily.  . canagliflozin (INVOKANA) 300 MG TABS tablet Take 1 tablet (300 mg total) by mouth daily before breakfast.  . cyclobenzaprine (FLEXERIL) 10 MG tablet TAKE 1 TABLET THREE TIMES A DAY AS NEEDED  . Dulaglutide (TRULICITY) 1.5 RS/8.5IO SOPN INJECT 1.5 MG UNDER THE SKIN ONCE A WEEK  . esomeprazole (NEXIUM) 40 MG capsule Take 1 cap po daily  . etodolac (LODINE) 400 MG tablet Take 1 tablet (400 mg total) by mouth 2 (two) times daily.  . fluticasone (FLONASE) 50 MCG/ACT nasal spray Place 1-2 sprays into both nostrils daily as needed. For allergies.  Marland Kitchen glimepiride (AMARYL) 4 MG tablet TAKE 1 TABLET WITH LARGEST MEAL OF THE DAY  . glucose blood (ACCU-CHEK GUIDE) test strip Use as instructed  . Lancets (ACCU-CHEK MULTICLIX) lancets Use as instructed  . Melatonin-Pyridoxine (MELATONIN/VITAMIN B-6 EX ST) 5-1 MG TABS Take 1 tablet by mouth at bedtime as needed. For sleep.  . meloxicam (MOBIC) 15 MG tablet TAKE 1 TABLET DAILY AS NEEDED FOR PAIN  . montelukast (SINGULAIR) 10 MG tablet Take 10 mg by mouth at bedtime.  . Multiple Vitamin (MULTIVITAMIN WITH MINERALS) TABS tablet Take 1 tablet by mouth daily. men  multivitamin  . oxyCODONE-acetaminophen (PERCOCET) 7.5-325 MG tablet Take 1 tablet by mouth every 6 (six) hours as needed for severe pain.  Marland Kitchen quinapril (ACCUPRIL) 20 MG tablet Take 1 tablet (20 mg total) by mouth daily.  . traMADol (ULTRAM) 50 MG tablet Take 1 tablet po BID prn pain  . zolpidem (AMBIEN) 10 MG tablet Take 1 tablet (10 mg total) by mouth at bedtime as needed for sleep.   No facility-administered encounter medications on file as of 05/03/2020.    Surgical History: Past Surgical History:  Procedure Laterality Date  . FOOT MASS EXCISION Right 2007  . PLANTAR FASCIA RELEASE Left 06/09/2016   Procedure: Radical excision plantar fibromas of left plantar arch and left hallux;  Surgeon: Sharlotte Alamo, DPM;  Location: [facility] ORS;  Service: Podiatry;  Laterality: Left;    Medical History: Past Medical History:  Diagnosis Date  . Anxiety   . Depression   . Diabetes mellitus without complication ([place])   . GERD (gastroesophageal reflux disease)   . Headache    history of migraines  . Hypertension     Family History: Family History  Problem Relation Age of Onset  . Diabetes Father   . Hypertension Father       Review of Systems  Constitutional: Negative.  Negative for chills, fatigue and unexpected weight change.  HENT: Negative.  Negative for congestion, rhinorrhea, sneezing and sore throat.   Eyes: Negative  for redness.  Respiratory: Negative.  Negative for cough, chest tightness and shortness of breath.   Cardiovascular: Negative.  Negative for chest pain and palpitations.  Gastrointestinal: Negative.  Negative for abdominal pain, constipation, diarrhea, nausea and vomiting.  Endocrine: Negative.   Genitourinary: Negative.  Negative for dysuria and frequency.  Musculoskeletal: Negative.  Negative for arthralgias, back pain, joint swelling and neck pain.  Skin: Negative.  Negative for rash.  Allergic/Immunologic: Negative.   Neurological: Negative.  Negative for tremors  and numbness.  Hematological: Negative for adenopathy. Does not bruise/bleed easily.  Psychiatric/Behavioral: Negative.  Negative for behavioral problems, sleep disturbance and suicidal ideas. The patient is not nervous/anxious.      Vital Signs: BP (!) 130/106   Pulse (!) 103   Temp 97.7 F (36.5 C)   Resp 16   Ht 6\' 3"  (1.905 m)   Wt 233 lb 6.4 oz (105.9 kg)   SpO2 98%   BMI 29.17 kg/m    Physical Exam Vitals and nursing note reviewed.  Constitutional:      General: He is not in acute distress.    Appearance: He is well-developed. He is not diaphoretic.  HENT:     Head: Normocephalic and atraumatic.     Mouth/Throat:     Pharynx: No oropharyngeal exudate.  Eyes:     Pupils: Pupils are equal, round, and reactive to light.  Neck:     Thyroid: No thyromegaly.     Vascular: No JVD.     Trachea: No tracheal deviation.  Cardiovascular:     Rate and Rhythm: Normal rate and regular rhythm.     Heart sounds: Normal heart sounds. No murmur heard.  No friction rub. No gallop.   Pulmonary:     Effort: Pulmonary effort is normal. No respiratory distress.     Breath sounds: Normal breath sounds. No wheezing or rales.  Chest:     Chest wall: No tenderness.  Abdominal:     Palpations: Abdomen is soft.     Tenderness: There is no abdominal tenderness. There is no guarding.  Musculoskeletal:        General: Normal range of motion.     Cervical back: Normal range of motion and neck supple.  Lymphadenopathy:     Cervical: No cervical adenopathy.  Skin:    General: Skin is warm and dry.  Neurological:     Mental Status: He is alert and oriented to person, place, and time.     Cranial Nerves: No cranial nerve deficit.  Psychiatric:        Behavior: Behavior normal.        Thought Content: Thought content normal.        Judgment: Judgment normal.     LABS: No results found for this or any previous visit (from the past 2160 hour(s)).   Assessment/Plan: 1. Encounter for  general adult medical examination with abnormal findings Up to date on PHM  2. Uncontrolled type 2 diabetes mellitus with hyperglycemia (HCC) Continue Amaryl, trulicity and invokana as prescribed.  - POCT HgB A1C - glimepiride (AMARYL) 4 MG tablet; Take one tab by mouth daily  Dispense: 90 tablet; Refill: 1  3. Essential (primary) hypertension Elevated today, restart accupril, continue amlodipine.  - quinapril (ACCUPRIL) 20 MG tablet; Take 1 tablet (20 mg total) by mouth daily.  Dispense: 90 tablet; Refill: 1  4. Allergy, subsequent encounter Refilled Singulair, good control of symptoms.  - montelukast (SINGULAIR) 10 MG tablet; Take 1 tablet (10 mg  total) by mouth at bedtime.  Dispense: 90 tablet; Refill: 1  5. Chronic back pain greater than 3 months duration Stable, continue to use flexeril as needed.  - cyclobenzaprine (FLEXERIL) 10 MG tablet; Take 1 tablet (10 mg total) by mouth 3 (three) times daily as needed.  Dispense: 30 tablet; Refill: 0  6. Dysuria - UA/M w/rflx Culture, Routine  General Counseling: Renata Caprice understanding of the findings of todays visit and agrees with plan of treatment. I have discussed any further diagnostic evaluation that may be needed or ordered today. We also reviewed his medications today. he has been encouraged to call the office with any questions or concerns that should arise related to todays visit.   Orders Placed This Encounter  Procedures  . UA/M w/rflx Culture, Routine  . POCT HgB A1C    No orders of the defined types were placed in this encounter.   Time spent: 30 Minutes   This patient was seen by Orson Gear AGNP-C in Collaboration with Dr Lavera Guise as a part of collaborative care agreement    Kendell Bane AGNP-C Internal Medicine

## 2020-05-04 LAB — UA/M W/RFLX CULTURE, ROUTINE
Bilirubin, UA: NEGATIVE
Ketones, UA: NEGATIVE
Leukocytes,UA: NEGATIVE
Nitrite, UA: NEGATIVE
RBC, UA: NEGATIVE
Specific Gravity, UA: 1.02 (ref 1.005–1.030)
Urobilinogen, Ur: 0.2 mg/dL (ref 0.2–1.0)
pH, UA: 5 (ref 5.0–7.5)

## 2020-05-04 LAB — MICROSCOPIC EXAMINATION
Bacteria, UA: NONE SEEN
Casts: NONE SEEN /lpf
RBC, Urine: NONE SEEN /hpf (ref 0–2)

## 2020-05-11 DIAGNOSIS — Z125 Encounter for screening for malignant neoplasm of prostate: Secondary | ICD-10-CM | POA: Diagnosis not present

## 2020-05-11 DIAGNOSIS — Z79899 Other long term (current) drug therapy: Secondary | ICD-10-CM | POA: Diagnosis not present

## 2020-05-11 DIAGNOSIS — Z0001 Encounter for general adult medical examination with abnormal findings: Secondary | ICD-10-CM | POA: Diagnosis not present

## 2020-05-12 LAB — LIPID PANEL WITH LDL/HDL RATIO
Cholesterol, Total: 195 mg/dL (ref 100–199)
HDL: 41 mg/dL (ref >39)
LDL Chol Calc (NIH): 137 mg/dL — ABNORMAL HIGH (ref 0–99)
LDL/HDL Ratio: 3.3 ratio (ref 0.0–3.6)
Triglycerides: 95 mg/dL (ref 0–149)
VLDL Cholesterol Cal: 17 mg/dL (ref 5–40)

## 2020-05-12 LAB — CBC WITH DIFFERENTIAL/PLATELET
Basophils Absolute: 0.1 10*3/uL (ref 0.0–0.2)
Basos: 1 %
EOS (ABSOLUTE): 0.1 10*3/uL (ref 0.0–0.4)
Eos: 1 %
Hematocrit: 49.9 % (ref 37.5–51.0)
Hemoglobin: 16.7 g/dL (ref 13.0–17.7)
Immature Grans (Abs): 0 10*3/uL (ref 0.0–0.1)
Immature Granulocytes: 1 %
Lymphocytes Absolute: 1.4 10*3/uL (ref 0.7–3.1)
Lymphs: 28 %
MCH: 27.7 pg (ref 26.6–33.0)
MCHC: 33.5 g/dL (ref 31.5–35.7)
MCV: 83 fL (ref 79–97)
Monocytes Absolute: 0.5 10*3/uL (ref 0.1–0.9)
Monocytes: 9 %
Neutrophils Absolute: 3.1 10*3/uL (ref 1.4–7.0)
Neutrophils: 60 %
Platelets: 237 10*3/uL (ref 150–450)
RBC: 6.03 x10E6/uL — ABNORMAL HIGH (ref 4.14–5.80)
RDW: 16.2 % — ABNORMAL HIGH (ref 11.6–15.4)
WBC: 5.1 10*3/uL (ref 3.4–10.8)

## 2020-05-12 LAB — COMPREHENSIVE METABOLIC PANEL
ALT: 34 IU/L (ref 0–44)
AST: 23 IU/L (ref 0–40)
Albumin/Globulin Ratio: 1.9 (ref 1.2–2.2)
Albumin: 4.8 g/dL (ref 3.8–4.9)
Alkaline Phosphatase: 99 IU/L (ref 44–121)
BUN/Creatinine Ratio: 13 (ref 9–20)
BUN: 14 mg/dL (ref 6–24)
Bilirubin Total: 0.4 mg/dL (ref 0.0–1.2)
CO2: 20 mmol/L (ref 20–29)
Calcium: 9.5 mg/dL (ref 8.7–10.2)
Chloride: 106 mmol/L (ref 96–106)
Creatinine, Ser: 1.11 mg/dL (ref 0.76–1.27)
GFR calc Af Amer: 88 mL/min/{1.73_m2} (ref >59)
GFR calc non Af Amer: 76 mL/min/{1.73_m2} (ref >59)
Globulin, Total: 2.5 g/dL (ref 1.5–4.5)
Glucose: 141 mg/dL — ABNORMAL HIGH (ref 65–99)
Potassium: 4.2 mmol/L (ref 3.5–5.2)
Sodium: 141 mmol/L (ref 134–144)
Total Protein: 7.3 g/dL (ref 6.0–8.5)

## 2020-05-12 LAB — T4, FREE: Free T4: 1.14 ng/dL (ref 0.82–1.77)

## 2020-05-12 LAB — PSA: Prostate Specific Ag, Serum: 0.5 ng/mL (ref 0.0–4.0)

## 2020-05-12 LAB — TSH: TSH: 1.43 u[IU]/mL (ref 0.450–4.500)

## 2020-05-24 ENCOUNTER — Other Ambulatory Visit: Payer: Self-pay

## 2020-05-24 DIAGNOSIS — E1165 Type 2 diabetes mellitus with hyperglycemia: Secondary | ICD-10-CM

## 2020-05-24 DIAGNOSIS — I10 Essential (primary) hypertension: Secondary | ICD-10-CM

## 2020-05-24 MED ORDER — QUINAPRIL HCL 20 MG PO TABS: 20.0000 mg | ORAL_TABLET | Freq: Every day | ORAL | 0 refills | Status: DC

## 2020-05-24 MED ORDER — GLIMEPIRIDE 4 MG PO TABS: ORAL_TABLET | ORAL | 1 refills | Status: DC

## 2020-05-24 MED ORDER — AMLODIPINE BESYLATE 10 MG PO TABS
10.0000 mg | ORAL_TABLET | Freq: Every day | ORAL | 0 refills | Status: DC
Start: 2020-05-24 — End: 2020-08-25

## 2020-05-24 NOTE — Telephone Encounter (Signed)
Pt wife called that they left med  When he went for  Vacation send glimepiride,and amlodipine and quinapril to [facility] for 20 days

## 2020-05-28 ENCOUNTER — Other Ambulatory Visit: Payer: Self-pay

## 2020-05-28 DIAGNOSIS — G4700 Insomnia, unspecified: Secondary | ICD-10-CM

## 2020-05-28 MED ORDER — ZOLPIDEM TARTRATE 10 MG PO TABS: 10.0000 mg | ORAL_TABLET | Freq: Every evening | ORAL | 0 refills | Status: DC | PRN

## 2020-06-14 ENCOUNTER — Encounter: Payer: Self-pay | Admitting: Internal Medicine

## 2020-06-14 ENCOUNTER — Ambulatory Visit (INDEPENDENT_AMBULATORY_CARE_PROVIDER_SITE_OTHER): Payer: BC Managed Care – PPO | Admitting: Internal Medicine

## 2020-06-14 ENCOUNTER — Other Ambulatory Visit: Payer: Self-pay

## 2020-06-14 ENCOUNTER — Telehealth: Payer: Self-pay

## 2020-06-14 VITALS — BP 138/94 | HR 97 | Temp 98.3°F | Resp 16 | Ht 75.0 in | Wt 234.0 lb

## 2020-06-14 DIAGNOSIS — E782 Mixed hyperlipidemia: Secondary | ICD-10-CM

## 2020-06-14 DIAGNOSIS — E1165 Type 2 diabetes mellitus with hyperglycemia: Secondary | ICD-10-CM

## 2020-06-14 DIAGNOSIS — Z1211 Encounter for screening for malignant neoplasm of colon: Secondary | ICD-10-CM

## 2020-06-14 DIAGNOSIS — I10 Essential (primary) hypertension: Secondary | ICD-10-CM | POA: Diagnosis not present

## 2020-06-14 DIAGNOSIS — M549 Dorsalgia, unspecified: Secondary | ICD-10-CM

## 2020-06-14 DIAGNOSIS — G4700 Insomnia, unspecified: Secondary | ICD-10-CM

## 2020-06-14 DIAGNOSIS — G8929 Other chronic pain: Secondary | ICD-10-CM

## 2020-06-14 MED ORDER — ZOLPIDEM TARTRATE 10 MG PO TABS: 10.0000 mg | ORAL_TABLET | Freq: Every evening | ORAL | 0 refills | Status: DC | PRN

## 2020-06-14 MED ORDER — ROSUVASTATIN CALCIUM 5 MG PO TABS: ORAL_TABLET | ORAL | 3 refills | Status: DC

## 2020-06-14 MED ORDER — CYCLOBENZAPRINE HCL 10 MG PO TABS: ORAL_TABLET | ORAL | 1 refills | Status: DC

## 2020-06-14 NOTE — Progress Notes (Addendum)
[facility] [place], [place] [address]  Internal MEDICINE  Office Visit Note  Patient Name: Clinton Moore  [id number]  [id number]  Date of Service: 06/14/2020  Chief Complaint  Patient presents with  . Depression  . Diabetes  . Gastroesophageal Reflux  . Hypertension  . Quality Metric Gaps    tetnaus Hep C  . controlled substance form    reviewed with PT  . Medication Refill    flexeril and ambien    HPI Pt is here for routine follow up. Diabetes is under better control, Pt is on invokana, trulicity and Amaryl. Feels well but has gained some weight. C/o sleep problems, c/o back and neck pain as well. Had colonoscopy 3-5 years ago, is interested in getting Cologuard. LDL is not at target. Blood pressure is under good control Denies any c/p or sob  Last hg aic is 6.4  Current Medication: Outpatient Encounter Medications as of 06/14/2020  Medication Sig  . amLODipine (NORVASC) 10 MG tablet Take 1 tablet (10 mg total) by mouth daily.  . canagliflozin (INVOKANA) 300 MG TABS tablet Take 1 tablet (300 mg total) by mouth daily before breakfast.  . cyclobenzaprine (FLEXERIL) 10 MG tablet Take one tab po qd as needed for back spasm  . Dulaglutide (TRULICITY) 1.5 WI/2.0BT SOPN INJECT 1.5 MG UNDER THE SKIN ONCE A WEEK  . esomeprazole (NEXIUM) 40 MG capsule Take 1 cap po daily  . etodolac (LODINE) 400 MG tablet Take 1 tablet (400 mg total) by mouth 2 (two) times daily.  . fluticasone (FLONASE) 50 MCG/ACT nasal spray Place 1-2 sprays into both nostrils daily as needed. For allergies.  Marland Kitchen glimepiride (AMARYL) 4 MG tablet Take one tab by mouth daily  . glucose blood (ACCU-CHEK GUIDE) test strip Use as instructed  . Lancets (ACCU-CHEK MULTICLIX) lancets Use as instructed  . Melatonin-Pyridoxine (MELATONIN/VITAMIN B-6 EX ST) 5-1 MG TABS Take 1 tablet by mouth at bedtime as needed. For sleep.  . meloxicam (MOBIC) 15 MG tablet TAKE 1 TABLET DAILY AS NEEDED FOR  PAIN  . montelukast (SINGULAIR) 10 MG tablet Take 1 tablet (10 mg total) by mouth at bedtime.  . Multiple Vitamin (MULTIVITAMIN WITH MINERALS) TABS tablet Take 1 tablet by mouth daily. men multivitamin  . oxyCODONE-acetaminophen (PERCOCET) 7.5-325 MG tablet Take 1 tablet by mouth every 6 (six) hours as needed for severe pain.  Marland Kitchen quinapril (ACCUPRIL) 20 MG tablet Take 1 tablet (20 mg total) by mouth daily.  . traMADol (ULTRAM) 50 MG tablet Take 1 tablet po BID prn pain  . zolpidem (AMBIEN) 10 MG tablet Take 1 tablet (10 mg total) by mouth at bedtime as needed for sleep.  . [DISCONTINUED] cyclobenzaprine (FLEXERIL) 10 MG tablet Take 1 tablet (10 mg total) by mouth 3 (three) times daily as needed.  . [DISCONTINUED] zolpidem (AMBIEN) 10 MG tablet Take 1 tablet (10 mg total) by mouth at bedtime as needed for sleep.  . rosuvastatin (CRESTOR) 5 MG tablet Take one tab 2 x a week for high chol   No facility-administered encounter medications on file as of 06/14/2020.    Surgical History: Past Surgical History:  Procedure Laterality Date  . FOOT MASS EXCISION Right 2007  . PLANTAR FASCIA RELEASE Left 06/09/2016   Procedure: Radical excision plantar fibromas of left plantar arch and left hallux;  Surgeon: Sharlotte Alamo, DPM;  Location: [facility] ORS;  Service: Podiatry;  Laterality: Left;    Medical History: Past Medical History:  Diagnosis Date  .  Anxiety   . Depression   . Diabetes mellitus without complication ([place])   . GERD (gastroesophageal reflux disease)   . Headache    history of migraines  . Hypertension     Family History: Family History  Problem Relation Age of Onset  . Diabetes Father   . Hypertension Father     Social History   Socioeconomic History  . Marital status: Married    Spouse name: Not on file  . Number of children: Not on file  . Years of education: Not on file  . Highest education level: Not on file  Occupational History  . Not on file  Tobacco Use  . Smoking  status: Current Some Day Smoker    Types: Cigarettes  . Smokeless tobacco: Never Used  . Tobacco comment: social smoker   Substance and Sexual Activity  . Alcohol use: Yes    Alcohol/week: 3.0 standard drinks    Types: 3 Cans of beer per week  . Drug use: No  . Sexual activity: Never  Other Topics Concern  . Not on file  Social History Narrative  . Not on file   Social Determinants of Health   Financial Resource Strain:   . Difficulty of Paying Living Expenses: Not on file  Food Insecurity:   . Worried About Charity fundraiser in the Last Year: Not on file  . Ran Out of Food in the Last Year: Not on file  Transportation Needs:   . Lack of Transportation (Medical): Not on file  . Lack of Transportation (Non-Medical): Not on file  Physical Activity:   . Days of Exercise per Week: Not on file  . Minutes of Exercise per Session: Not on file  Stress:   . Feeling of Stress : Not on file  Social Connections:   . Frequency of Communication with Friends and Family: Not on file  . Frequency of Social Gatherings with Friends and Family: Not on file  . Attends Religious Services: Not on file  . Active Member of Clubs or Organizations: Not on file  . Attends Archivist Meetings: Not on file  . Marital Status: Not on file  Intimate Partner Violence:   . Fear of Current or Ex-Partner: Not on file  . Emotionally Abused: Not on file  . Physically Abused: Not on file  . Sexually Abused: Not on file      Review of Systems  Constitutional: Negative for chills, fatigue and unexpected weight change.  HENT: Positive for postnasal drip. Negative for congestion, rhinorrhea, sneezing and sore throat.   Eyes: Negative for redness.  Respiratory: Negative for cough, chest tightness and shortness of breath.   Cardiovascular: Negative for chest pain and palpitations.  Gastrointestinal: Negative for abdominal pain, constipation, diarrhea, nausea and vomiting.  Genitourinary: Negative  for dysuria and frequency.  Musculoskeletal: Positive for back pain. Negative for arthralgias, joint swelling and neck pain.  Skin: Negative for rash.  Neurological: Negative.  Negative for tremors and numbness.  Hematological: Negative for adenopathy. Does not bruise/bleed easily.  Psychiatric/Behavioral: Positive for sleep disturbance. Negative for behavioral problems (Depression) and suicidal ideas. The patient is not nervous/anxious.     Vital Signs: BP (!) 138/94   Pulse 97   Temp 98.3 F (36.8 C)   Resp 16   Ht 6\' 3"  (4.235 m)   Wt 234 lb (106.1 kg)   SpO2 97%   BMI 29.25 kg/m    Physical Exam Constitutional:  General: He is not in acute distress.    Appearance: He is well-developed. He is not diaphoretic.  HENT:     Head: Normocephalic and atraumatic.     Mouth/Throat:     Pharynx: No oropharyngeal exudate.  Eyes:     Pupils: Pupils are equal, round, and reactive to light.  Neck:     Thyroid: No thyromegaly.     Vascular: No JVD.     Trachea: No tracheal deviation.  Cardiovascular:     Rate and Rhythm: Normal rate and regular rhythm.     Heart sounds: Normal heart sounds. No murmur heard.  No friction rub. No gallop.   Pulmonary:     Effort: Pulmonary effort is normal. No respiratory distress.     Breath sounds: No wheezing or rales.  Chest:     Chest wall: No tenderness.  Abdominal:     General: Bowel sounds are normal.     Palpations: Abdomen is soft.  Musculoskeletal:        General: Normal range of motion.     Cervical back: Normal range of motion and neck supple.  Lymphadenopathy:     Cervical: No cervical adenopathy.  Skin:    General: Skin is warm and dry.  Neurological:     Mental Status: He is alert and oriented to person, place, and time.     Cranial Nerves: No cranial nerve deficit.  Psychiatric:        Behavior: Behavior normal.        Thought Content: Thought content normal.        Judgment: Judgment normal.      Assessment/Plan: 1. Uncontrolled type 2 diabetes mellitus with hyperglycemia (HCC) Continue all meds, however might be able to eventually stop Amaryl.    2. Mixed hyperlipidemia Start Statin for CV benefit, primary prevention  - rosuvastatin (CRESTOR) 5 MG tablet; Take one tab 2 x a week for high chol  Dispense: 45 tablet; Refill: 3  3. Essential (primary) hypertension Well controlled   4. Insomnia, unspecified type Take only as needed. Symptoms of OSA is discussed  - zolpidem (AMBIEN) 10 MG tablet; Take 1 tablet (10 mg total) by mouth at bedtime as needed for sleep.  Dispense: 90 tablet; Refill: 0  5. Chronic back pain greater than 3 months duration Stretching and back exercises due to posture and long hours at work  - cyclobenzaprine (FLEXERIL) 10 MG tablet; Take one tab po qd as needed for back spasm  Dispense: 45 tablet; Refill: 1  6. Screening for colon cancer Cologuard is sent   Diabetes Counseling:  1. Addition of ACE inh/ ARB'S for nephroprotection. Microalbumin is updated  2. Diabetic foot care, prevention of complications. Podiatry consult 3. Exercise and lose weight.  4. Diabetic eye examination, Diabetic eye exam is updated  5. Monitor blood sugar closlely. nutrition counseling.  6. Sign and symptoms of hypoglycemia including shaking sweating,confusion and headaches.  Meds ordered this encounter  Medications  . zolpidem (AMBIEN) 10 MG tablet    Sig: Take 1 tablet (10 mg total) by mouth at bedtime as needed for sleep.    Dispense:  90 tablet    Refill:  0  . cyclobenzaprine (FLEXERIL) 10 MG tablet    Sig: Take one tab po qd as needed for back spasm    Dispense:  45 tablet    Refill:  1  . rosuvastatin (CRESTOR) 5 MG tablet    Sig: Take one tab 2 x a week for  high chol    Dispense:  45 tablet    Refill:  3    Total time spent:35 Minutes Time spent includes review of chart, medications, test results, and follow up plan with the patient.      Dr  Lavera Guise Internal medicine

## 2020-06-14 NOTE — Telephone Encounter (Signed)
-----   Message from Lavera Guise, MD sent at 06/14/2020  4:14 PM EST ----- Cologaurd

## 2020-06-14 NOTE — Telephone Encounter (Signed)
Faxed cologuard 

## 2020-07-05 DIAGNOSIS — Z1211 Encounter for screening for malignant neoplasm of colon: Secondary | ICD-10-CM | POA: Diagnosis not present

## 2020-07-05 DIAGNOSIS — Z1212 Encounter for screening for malignant neoplasm of rectum: Secondary | ICD-10-CM | POA: Diagnosis not present

## 2020-07-11 LAB — EXTERNAL GENERIC LAB PROCEDURE: COLOGUARD: NEGATIVE

## 2020-07-11 LAB — COLOGUARD: COLOGUARD: NEGATIVE

## 2020-07-12 ENCOUNTER — Telehealth: Payer: Self-pay

## 2020-07-12 NOTE — Telephone Encounter (Signed)
Pt advised  cologuard is negative  

## 2020-07-15 LAB — COLOGUARD

## 2020-07-18 ENCOUNTER — Other Ambulatory Visit: Payer: Self-pay | Admitting: Adult Health

## 2020-07-18 DIAGNOSIS — E1165 Type 2 diabetes mellitus with hyperglycemia: Secondary | ICD-10-CM

## 2020-07-22 ENCOUNTER — Other Ambulatory Visit: Payer: Self-pay

## 2020-07-22 DIAGNOSIS — E1165 Type 2 diabetes mellitus with hyperglycemia: Secondary | ICD-10-CM

## 2020-07-22 MED ORDER — CANAGLIFLOZIN 300 MG PO TABS: 300.0000 mg | ORAL_TABLET | Freq: Every day | ORAL | 1 refills | Status: DC

## 2020-07-22 MED ORDER — ESOMEPRAZOLE MAGNESIUM 40 MG PO CPDR: DELAYED_RELEASE_CAPSULE | ORAL | 1 refills | Status: DC

## 2020-08-23 ENCOUNTER — Other Ambulatory Visit: Payer: Self-pay | Admitting: Adult Health

## 2020-08-25 ENCOUNTER — Other Ambulatory Visit: Payer: Self-pay

## 2020-08-25 MED ORDER — AMLODIPINE BESYLATE 10 MG PO TABS: 10.0000 mg | ORAL_TABLET | Freq: Every day | ORAL | 1 refills | Status: DC

## 2020-08-27 ENCOUNTER — Other Ambulatory Visit: Payer: Self-pay

## 2020-08-27 MED ORDER — AMLODIPINE BESYLATE 10 MG PO TABS: 10.0000 mg | ORAL_TABLET | Freq: Every day | ORAL | 1 refills | Status: DC

## 2020-10-14 ENCOUNTER — Other Ambulatory Visit: Payer: Self-pay

## 2020-10-14 ENCOUNTER — Encounter: Payer: Self-pay | Admitting: Hospice and Palliative Medicine

## 2020-10-14 ENCOUNTER — Ambulatory Visit: Payer: BC Managed Care – PPO | Admitting: Hospice and Palliative Medicine

## 2020-10-14 VITALS — BP 136/84 | HR 95 | Temp 97.4°F | Resp 16 | Ht 75.0 in | Wt 235.6 lb

## 2020-10-14 DIAGNOSIS — I10 Essential (primary) hypertension: Secondary | ICD-10-CM

## 2020-10-14 DIAGNOSIS — E782 Mixed hyperlipidemia: Secondary | ICD-10-CM

## 2020-10-14 DIAGNOSIS — E1165 Type 2 diabetes mellitus with hyperglycemia: Secondary | ICD-10-CM | POA: Diagnosis not present

## 2020-10-14 DIAGNOSIS — M549 Dorsalgia, unspecified: Secondary | ICD-10-CM

## 2020-10-14 DIAGNOSIS — G4700 Insomnia, unspecified: Secondary | ICD-10-CM

## 2020-10-14 DIAGNOSIS — G8929 Other chronic pain: Secondary | ICD-10-CM

## 2020-10-14 LAB — POCT GLYCOSYLATED HEMOGLOBIN (HGB A1C): Hemoglobin A1C: 7.7 % — AB (ref 4.0–5.6)

## 2020-10-14 MED ORDER — TRULICITY 3 MG/0.5ML ~~LOC~~ SOAJ: 3.0000 mg | SUBCUTANEOUS | 2 refills | Status: DC

## 2020-10-14 MED ORDER — MIRTAZAPINE 7.5 MG PO TABS: 7.5000 mg | ORAL_TABLET | Freq: Every day | ORAL | 0 refills | Status: DC

## 2020-10-14 MED ORDER — OXYCODONE-ACETAMINOPHEN 7.5-325 MG PO TABS: 1.0000 | ORAL_TABLET | Freq: Four times a day (QID) | ORAL | 0 refills | Status: DC | PRN

## 2020-10-14 NOTE — Progress Notes (Signed)
[facility] [place], [place] [address]  Internal MEDICINE  Office Visit Note  Patient Name: Clinton Moore  [id number]  [id number]  Date of Service: 10/17/2020  Chief Complaint  Patient presents with  . Follow-up    Refills   . Hypertension  . Gastroesophageal Reflux  . Depression  . Diabetes  . Anxiety  . 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 Dulaglutide (TRULICITY) 3 GB/1.5VV SOPN Inject 3 mg as directed once a week.  . mirtazapine (REMERON) 7.5 MG tablet Take 1 tablet (7.5 mg total) by mouth at bedtime.  Marland Kitchen amLODipine (NORVASC) 10 MG tablet Take 1 tablet (10 mg total) by mouth daily.  . canagliflozin (INVOKANA) 300 MG TABS tablet Take 1 tablet (300 mg total) by mouth daily before breakfast.  . cyclobenzaprine (FLEXERIL) 10 MG tablet Take one tab po qd as needed for back spasm  . esomeprazole (NEXIUM) 40 MG capsule Take 1 cap po daily  . etodolac (LODINE) 400 MG tablet Take 1 tablet (400 mg total) by mouth 2 (two) times daily.  . fluticasone (FLONASE) 50 MCG/ACT nasal spray Place 1-2 sprays into both nostrils daily as needed. For allergies.  Marland Kitchen glimepiride (AMARYL) 4 MG tablet Take one tab by mouth daily  . glucose blood (ACCU-CHEK GUIDE) test strip Use as instructed  . Lancets (ACCU-CHEK MULTICLIX) lancets Use as instructed  . Melatonin-Pyridoxine (MELATONIN/VITAMIN B-6 EX ST) 5-1 MG TABS  Take 1 tablet by mouth at bedtime as needed. For sleep.  . meloxicam (MOBIC) 15 MG tablet TAKE 1 TABLET DAILY AS NEEDED FOR PAIN  . montelukast (SINGULAIR) 10 MG tablet Take 1 tablet (10 mg total) by mouth at bedtime.  . Multiple Vitamin (MULTIVITAMIN WITH MINERALS) TABS tablet Take 1 tablet by mouth daily. men multivitamin  . oxyCODONE-acetaminophen (PERCOCET) 7.5-325 MG tablet Take 1 tablet by mouth every 6 (six) hours as needed for severe pain.  Marland Kitchen quinapril (ACCUPRIL) 20 MG tablet Take 1 tablet (20 mg total) by mouth daily.  . rosuvastatin (CRESTOR) 5 MG tablet Take one tab 2 x a week for high chol  . traMADol (ULTRAM) 50 MG tablet Take 1 tablet po BID prn pain  . zolpidem (AMBIEN) 10 MG tablet Take 1 tablet (10 mg total) by mouth at bedtime as needed for sleep.  . [DISCONTINUED] Dulaglutide (TRULICITY) 1.5 OH/6.0VP SOPN INJECT 1.5 MG UNDER THE SKIN ONCE A WEEK  . [DISCONTINUED] oxyCODONE-acetaminophen (PERCOCET) 7.5-325 MG tablet Take 1 tablet by mouth every 6 (six) hours as needed for severe pain.   No facility-administered encounter medications on file as of 10/14/2020.    Surgical History: Past Surgical History:  Procedure Laterality Date  . FOOT MASS EXCISION Right 2007  . PLANTAR FASCIA RELEASE Left 06/09/2016   Procedure: Radical excision plantar fibromas of left plantar arch and left hallux;  Surgeon: Sharlotte Alamo, DPM;  Location: [facility] ORS;  Service: Podiatry;  Laterality: Left;  Medical History: Past Medical History:  Diagnosis Date  . Anxiety   . Depression   . Diabetes mellitus without complication ([place])   . GERD (gastroesophageal reflux disease)   . Headache    history of migraines  . Hypertension     Family History: Family History  Problem Relation Age of Onset  . Diabetes Father   . Hypertension Father     Social History   Socioeconomic History  . Marital status: Married    Spouse name: Not on file  . Number of children: Not on file  . Years of  education: Not on file  . Highest education level: Not on file  Occupational History  . Not on file  Tobacco Use  . Smoking status: Current Some Day Smoker    Types: Cigarettes  . Smokeless tobacco: Never Used  . Tobacco comment: social smoker   Substance and Sexual Activity  . Alcohol use: Yes    Alcohol/week: 3.0 standard drinks    Types: 3 Cans of beer per week  . Drug use: No  . Sexual activity: Never  Other Topics Concern  . Not on file  Social History Narrative  . Not on file   Social Determinants of Health   Financial Resource Strain: Not on file  Food Insecurity: Not on file  Transportation Needs: Not on file  Physical Activity: Not on file  Stress: Not on file  Social Connections: Not on file  Intimate Partner Violence: Not on file      Review of Systems  Constitutional: Negative for chills, fatigue and unexpected weight change.  HENT: Negative for congestion, postnasal drip, rhinorrhea, sneezing and sore throat.   Eyes: Negative for redness.  Respiratory: Negative for cough, chest tightness and shortness of breath.   Cardiovascular: Negative for chest pain and palpitations.  Gastrointestinal: Negative for abdominal pain, constipation, diarrhea, nausea and vomiting.  Genitourinary: Negative for dysuria and frequency.  Musculoskeletal: Positive for back pain. Negative for arthralgias, joint swelling and neck pain.  Skin: Negative for rash.  Neurological: Negative for tremors and numbness.  Hematological: Negative for adenopathy. Does not bruise/bleed easily.  Psychiatric/Behavioral: Positive for sleep disturbance. Negative for behavioral problems (Depression) and suicidal ideas. The patient is not nervous/anxious.     Vital Signs: BP 136/84   Pulse 95   Temp (!) 97.4 F (36.3 C)   Resp 16   Ht 6\' 3"  (1.905 m)   Wt 235 lb 9.6 oz (106.9 kg)   SpO2 97%   BMI 29.45 kg/m    Physical Exam Vitals reviewed.  Constitutional:      Appearance: Normal  appearance. He is normal weight.  Cardiovascular:     Rate and Rhythm: Normal rate and regular rhythm.     Pulses: Normal pulses.     Heart sounds: Normal heart sounds.  Pulmonary:     Effort: Pulmonary effort is normal.     Breath sounds: Normal breath sounds.  Abdominal:     General: Abdomen is flat.     Palpations: Abdomen is soft.  Musculoskeletal:        General: Normal range of motion.     Cervical back: Normal range of motion.  Skin:    General: Skin is warm.  Neurological:     General: No focal deficit present.     Mental Status: He is alert and oriented to person, place, and time. Mental status is at baseline.  Psychiatric:        Mood and Affect:  Mood normal.        Behavior: Behavior normal.        Thought Content: Thought content normal.        Judgment: Judgment normal.    Assessment/Plan: 1. Uncontrolled type 2 diabetes mellitus with hyperglycemia (HCC) A1C elevated at 7.7 Increase Trulicity dose to 3 mg weekly, advised to start limiting carbs and sugar intake - POCT HgB A1C - Dulaglutide (TRULICITY) 3 OZ/3.6UY SOPN; Inject 3 mg as directed once a week.  Dispense: 3 mL; Refill: 2  2. Chronic back pain greater than 3 months duration Requesting refills of Percocet, chronic back pain--based on severity of pain will treat with muscle relaxer vs Percocet Last refills for Percocet 2020, will also treat pain as needed with Tramadol--last refill for Tramadol in May 2021 [place] Controlled Substance Database was reviewed by me for overdose risk score (ORS) Reviewed risks and possible side effects associated with taking opiates, benzodiazepines and other CNS depressants. Combination of these could cause dizziness and drowsiness. Advised patient not to drive or operate machinery when taking these medications, as patient's and other's life can be at risk and will have consequences. Patient verbalized understanding in this matter. Dependence and abuse for these drugs will be  monitored closely. A Controlled substance policy and procedure is on file which allows [facility] to order a urine drug screen test at any visit. Patient understands and agrees with the plan - oxyCODONE-acetaminophen (PERCOCET) 7.5-325 MG tablet; Take 1 tablet by mouth every 6 (six) hours as needed for severe pain.  Dispense: 30 tablet; Refill: 0  3. Mixed hyperlipidemia Tolerating statin well  4. Essential hypertension BP and HR well controlled  5. Insomnia, unspecified type Trial Remeron, also encouraged to start Melatonin help with resetting circadian cycle due to long history of night shift work Continue discussion about possible OSA - mirtazapine (REMERON) 7.5 MG tablet; Take 1 tablet (7.5 mg total) by mouth at bedtime.  Dispense: 30 tablet; Refill: 0  General Counseling: Renata Caprice understanding of the findings of todays visit and agrees with plan of treatment. I have discussed any further diagnostic evaluation that may be needed or ordered today. We also reviewed his medications today. he has been encouraged to call the office with any questions or concerns that should arise related to todays visit.    Orders Placed This Encounter  Procedures  . POCT HgB A1C    Meds ordered this encounter  Medications  . mirtazapine (REMERON) 7.5 MG tablet    Sig: Take 1 tablet (7.5 mg total) by mouth at bedtime.    Dispense:  30 tablet    Refill:  0  . Dulaglutide (TRULICITY) 3 QI/3.4VQ SOPN    Sig: Inject 3 mg as directed once a week.    Dispense:  3 mL    Refill:  2  . oxyCODONE-acetaminophen (PERCOCET) 7.5-325 MG tablet    Sig: Take 1 tablet by mouth every 6 (six) hours as needed for severe pain.    Dispense:  30 tablet    Refill:  0    Time spent: 30 Minutes Time spent includes review of chart, medications, test results and follow-up plan with the patient.  This patient was seen by Theodoro Grist AGNP-C in Collaboration with Dr Lavera Guise as a part of  collaborative care agreement     Tanna Furry. Harris AGNP-C Internal medicine

## 2020-10-17 ENCOUNTER — Encounter: Payer: Self-pay | Admitting: Hospice and Palliative Medicine

## 2020-11-10 ENCOUNTER — Other Ambulatory Visit: Payer: Self-pay | Admitting: Hospice and Palliative Medicine

## 2020-11-10 DIAGNOSIS — G4700 Insomnia, unspecified: Secondary | ICD-10-CM

## 2020-11-16 ENCOUNTER — Other Ambulatory Visit: Payer: Self-pay

## 2020-11-16 DIAGNOSIS — G4700 Insomnia, unspecified: Secondary | ICD-10-CM

## 2020-11-16 MED ORDER — MIRTAZAPINE 7.5 MG PO TABS: ORAL_TABLET | ORAL | 0 refills | Status: DC

## 2020-11-29 ENCOUNTER — Other Ambulatory Visit: Payer: Self-pay | Admitting: Nurse Practitioner

## 2020-12-06 ENCOUNTER — Other Ambulatory Visit: Payer: Self-pay

## 2020-12-06 DIAGNOSIS — E1165 Type 2 diabetes mellitus with hyperglycemia: Secondary | ICD-10-CM

## 2020-12-06 MED ORDER — DAPAGLIFLOZIN PROPANEDIOL 10 MG PO TABS: 10.0000 mg | ORAL_TABLET | Freq: Every day | ORAL | 1 refills | Status: DC

## 2020-12-07 ENCOUNTER — Other Ambulatory Visit: Payer: Self-pay | Admitting: Nurse Practitioner

## 2020-12-07 DIAGNOSIS — I10 Essential (primary) hypertension: Secondary | ICD-10-CM

## 2020-12-21 ENCOUNTER — Other Ambulatory Visit: Payer: Self-pay | Admitting: Adult Health

## 2020-12-21 DIAGNOSIS — E1165 Type 2 diabetes mellitus with hyperglycemia: Secondary | ICD-10-CM

## 2020-12-22 ENCOUNTER — Other Ambulatory Visit: Payer: Self-pay

## 2020-12-22 DIAGNOSIS — I10 Essential (primary) hypertension: Secondary | ICD-10-CM

## 2020-12-22 MED ORDER — QUINAPRIL HCL 20 MG PO TABS: 20.0000 mg | ORAL_TABLET | Freq: Every day | ORAL | 1 refills | Status: DC

## 2020-12-23 ENCOUNTER — Other Ambulatory Visit: Payer: Self-pay

## 2020-12-23 DIAGNOSIS — E1165 Type 2 diabetes mellitus with hyperglycemia: Secondary | ICD-10-CM

## 2020-12-23 MED ORDER — GLIMEPIRIDE 4 MG PO TABS: ORAL_TABLET | ORAL | 1 refills | Status: DC

## 2021-01-03 ENCOUNTER — Other Ambulatory Visit: Payer: Self-pay

## 2021-01-03 DIAGNOSIS — E1165 Type 2 diabetes mellitus with hyperglycemia: Secondary | ICD-10-CM

## 2021-01-03 MED ORDER — TRULICITY 3 MG/0.5ML ~~LOC~~ SOAJ: 3.0000 mg | SUBCUTANEOUS | 1 refills | Status: AC

## 2021-01-11 ENCOUNTER — Telehealth: Payer: Self-pay

## 2021-01-11 NOTE — Telephone Encounter (Signed)
Left vm to do covid screening for 01/12/21 appointment-Toni

## 2021-01-12 ENCOUNTER — Other Ambulatory Visit: Payer: Self-pay

## 2021-01-12 ENCOUNTER — Encounter: Payer: Self-pay | Admitting: Nurse Practitioner

## 2021-01-12 ENCOUNTER — Ambulatory Visit (INDEPENDENT_AMBULATORY_CARE_PROVIDER_SITE_OTHER): Payer: BC Managed Care – PPO | Admitting: Nurse Practitioner

## 2021-01-12 VITALS — BP 132/98 | HR 100 | Temp 97.2°F | Resp 16 | Ht 75.0 in | Wt 238.4 lb

## 2021-01-12 DIAGNOSIS — I10 Essential (primary) hypertension: Secondary | ICD-10-CM

## 2021-01-12 DIAGNOSIS — E1165 Type 2 diabetes mellitus with hyperglycemia: Secondary | ICD-10-CM | POA: Diagnosis not present

## 2021-01-12 DIAGNOSIS — E782 Mixed hyperlipidemia: Secondary | ICD-10-CM | POA: Diagnosis not present

## 2021-01-12 DIAGNOSIS — Z23 Encounter for immunization: Secondary | ICD-10-CM

## 2021-01-12 DIAGNOSIS — J302 Other seasonal allergic rhinitis: Secondary | ICD-10-CM

## 2021-01-12 DIAGNOSIS — G4700 Insomnia, unspecified: Secondary | ICD-10-CM

## 2021-01-12 LAB — POCT GLYCOSYLATED HEMOGLOBIN (HGB A1C): Hemoglobin A1C: 8.8 % — AB (ref 4.0–5.6)

## 2021-01-12 MED ORDER — ZOSTER VAC RECOMB ADJUVANTED 50 MCG/0.5ML IM SUSR: 0.5000 mL | Freq: Once | INTRAMUSCULAR | 0 refills | Status: AC

## 2021-01-12 MED ORDER — FLUTICASONE PROPIONATE 50 MCG/ACT NA SUSP: 1.0000 | Freq: Every day | NASAL | 3 refills | Status: DC | PRN

## 2021-01-12 MED ORDER — MIRTAZAPINE 7.5 MG PO TABS
ORAL_TABLET | ORAL | 0 refills | Status: DC
Start: 2021-01-12 — End: 2021-06-02

## 2021-01-12 MED ORDER — PREVNAR 20 0.5 ML IM SUSY: 0.5000 mL | PREFILLED_SYRINGE | Freq: Once | INTRAMUSCULAR | 0 refills | Status: AC

## 2021-01-12 NOTE — Progress Notes (Signed)
[facility] [place], [place] [address]  Internal MEDICINE  Office Visit Note  Patient Name: Clinton Moore  [id number]  [id number]  Date of Service: 01/21/2021  Chief Complaint  Patient presents with   Follow-up    Refill request   Anxiety   Depression   Gastroesophageal Reflux   Diabetes   Hypertension   Quality Metric Gaps    Pneumovax, shingrix, eye exam, covid booster was done December 2021    HPI Clinton Moore presents for a follow up visit for diabetes to have his A1C checked and is also requesting medication refills. He has a history of anxiety, depression, GERD, and hypertension. He is interested in receiving his pneumococcal and shingles vaccines. He received his COVID booster in December 2021.  -A1C was 8.8 today which increased by 1.1 since march 2022. His last A1C was 7.7. He reports that he knows what the problem is. He has been eating whatever he wants and not decreasing high carb and high sugar foods. He has not been paying attention to what he eats or drinks. He reports that he will start now. He is currently taking trulicity 3 mg weekly, and farxiga 10 mg daily and glimepiride 4 mg daily -his blood pressure is not optimally controlled. He is currently taking amlodipine 10 mg and quinapril 20 mg. May consider adding hydrochlorothiazide if his BP remains elevated at his next office visit.     Current Medication: Outpatient Encounter Medications as of 01/12/2021  Medication Sig   amLODipine (NORVASC) 10 MG tablet Take 1 tablet (10 mg total) by mouth daily.   cyclobenzaprine (FLEXERIL) 10 MG tablet Take one tab po qd as needed for back spasm   dapagliflozin propanediol (FARXIGA) 10 MG TABS tablet Take 1 tablet (10 mg total) by mouth daily before breakfast.   Dulaglutide (TRULICITY) 3 FM/3.8GY SOPN Inject 3 mg as directed once a week.   esomeprazole (NEXIUM) 40 MG capsule Take 1 cap po daily   etodolac (LODINE) 400 MG tablet Take 1 tablet (400  mg total) by mouth 2 (two) times daily.   glimepiride (AMARYL) 4 MG tablet Take one tab by mouth daily   glucose blood (ACCU-CHEK GUIDE) test strip Use as instructed   Lancets (ACCU-CHEK MULTICLIX) lancets Use as instructed   Melatonin-Pyridoxine 5-1 MG TABS Take 1 tablet by mouth at bedtime as needed. For sleep.   meloxicam (MOBIC) 15 MG tablet TAKE 1 TABLET DAILY AS NEEDED FOR PAIN   montelukast (SINGULAIR) 10 MG tablet Take 1 tablet (10 mg total) by mouth at bedtime.   Multiple Vitamin (MULTIVITAMIN WITH MINERALS) TABS tablet Take 1 tablet by mouth daily. men multivitamin   oxyCODONE-acetaminophen (PERCOCET) 7.5-325 MG tablet Take 1 tablet by mouth every 6 (six) hours as needed for severe pain.   [EXPIRED] pneumococcal 20-Val Conj Vacc (PREVNAR 20) 0.5 ML SUSY Inject 0.5 mLs into the muscle once for 1 dose.   quinapril (ACCUPRIL) 20 MG tablet Take 1 tablet (20 mg total) by mouth daily.   rosuvastatin (CRESTOR) 5 MG tablet Take one tab 2 x a week for high chol   traMADol (ULTRAM) 50 MG tablet Take 1 tablet po BID prn pain   [EXPIRED] Zoster Vaccine Adjuvanted [facility]) injection Inject 0.5 mLs into the muscle once for 1 dose.   [DISCONTINUED] fluticasone (FLONASE) 50 MCG/ACT nasal spray Place 1-2 sprays into both nostrils daily as needed. For allergies.   [DISCONTINUED] mirtazapine (REMERON) 7.5 MG tablet TAKE 1 TABLET(7.5 MG) BY MOUTH  AT BEDTIME   fluticasone (FLONASE) 50 MCG/ACT nasal spray Place 1-2 sprays into both nostrils daily as needed. For allergies.   mirtazapine (REMERON) 7.5 MG tablet TAKE 1 TABLET(7.5 MG) BY MOUTH AT BEDTIME   [DISCONTINUED] zolpidem (AMBIEN) 10 MG tablet Take 1 tablet (10 mg total) by mouth at bedtime as needed for sleep.   No facility-administered encounter medications on file as of 01/12/2021.    Surgical History: Past Surgical History:  Procedure Laterality Date   FOOT MASS EXCISION Right 2007   PLANTAR FASCIA RELEASE Left 06/09/2016   Procedure:  Radical excision plantar fibromas of left plantar arch and left hallux;  Surgeon: Sharlotte Alamo, DPM;  Location: [facility] ORS;  Service: Podiatry;  Laterality: Left;    Medical History: Past Medical History:  Diagnosis Date   Anxiety    Depression    Diabetes mellitus without complication (HCC)    GERD (gastroesophageal reflux disease)    Headache    history of migraines   Hypertension     Family History: Family History  Problem Relation Age of Onset   Diabetes Father    Hypertension Father     Social History   Socioeconomic History   Marital status: Married    Spouse name: Not on file   Number of children: Not on file   Years of education: Not on file   Highest education level: Not on file  Occupational History   Not on file  Tobacco Use   Smoking status: Some Days    Pack years: 0.00    Types: Cigarettes   Smokeless tobacco: Never   Tobacco comments:    social smoker   Substance and Sexual Activity   Alcohol use: Yes    Alcohol/week: 3.0 standard drinks    Types: 3 Cans of beer per week   Drug use: No   Sexual activity: Never  Other Topics Concern   Not on file  Social History Narrative   Not on file   Social Determinants of Health   Financial Resource Strain: Not on file  Food Insecurity: Not on file  Transportation Needs: Not on file  Physical Activity: Not on file  Stress: Not on file  Social Connections: Not on file  Intimate Partner Violence: Not on file      Review of Systems  Constitutional:  Negative for chills, fatigue and unexpected weight change.  HENT:  Negative for congestion, rhinorrhea, sneezing and sore throat.   Eyes:  Negative for redness.  Respiratory:  Negative for cough, chest tightness and shortness of breath.   Cardiovascular:  Negative for chest pain and palpitations.  Gastrointestinal:  Negative for abdominal pain, constipation, diarrhea, nausea and vomiting.  Genitourinary:  Negative for dysuria and frequency.   Musculoskeletal:  Negative for arthralgias, back pain, joint swelling and neck pain.  Skin:  Negative for rash.  Neurological: Negative.  Negative for tremors and numbness.  Hematological:  Negative for adenopathy. Does not bruise/bleed easily.  Psychiatric/Behavioral:  Negative for behavioral problems (Depression), sleep disturbance and suicidal ideas. The patient is not nervous/anxious.    Vital Signs: BP (!) 132/98   Pulse 100   Temp (!) 97.2 F (36.2 C)   Resp 16   Ht 6\' 3"  (1.905 m)   Wt 238 lb 6.4 oz (108.1 kg)   SpO2 97%   BMI 29.80 kg/m    Physical Exam Vitals reviewed.  Constitutional:      General: He is not in acute distress.    Appearance: Normal  appearance. He is well-developed. He is obese. He is not ill-appearing or diaphoretic.  HENT:     Head: Normocephalic and atraumatic.  Neck:     Thyroid: No thyromegaly.     Vascular: No JVD.     Trachea: No tracheal deviation.  Cardiovascular:     Rate and Rhythm: Normal rate and regular rhythm.     Pulses: Normal pulses.     Heart sounds: Normal heart sounds. No murmur heard.   No friction rub. No gallop.  Pulmonary:     Effort: Pulmonary effort is normal. No respiratory distress.     Breath sounds: Normal breath sounds. No wheezing or rales.  Chest:     Chest wall: No tenderness.  Skin:    General: Skin is warm and dry.     Capillary Refill: Capillary refill takes less than 2 seconds.  Neurological:     Mental Status: He is alert and oriented to person, place, and time.  Psychiatric:        Mood and Affect: Mood normal.        Behavior: Behavior normal.    Assessment/Plan: 1. Uncontrolled type 2 diabetes mellitus with hyperglycemia (HCC) A1C is 8.8. this is up by 1.1 from 3 months ago. He has been nonadherent to diet and lifestyle modifications. He is working on improving this only recently. He is currently on 3 medications for glucose: trulicity 3 mg weekly, farxiga 10mg  daily and glimepiride 4 mg daily.  No changes will be made to his medications since the patient has been noncompliant and eating anything he wants. Will recheck A1C in 3 months, if it is still elevated or minimal change has been made, then his medications will be adjusted.  - POCT HgB A1C  2. Essential hypertension Patient is on antihypertensive medication, his blood pressure is still not optimally controlled, will consider adding hydrochlorothiazide if his blood pressure remains elevated   3. Mixed hyperlipidemia History of hyperlipidemia, stable, taking rosuvastatin  4. Insomnia, unspecified type Taking mirtazepine for insomnia, refill ordered.  - mirtazapine (REMERON) 7.5 MG tablet; TAKE 1 TABLET(7.5 MG) BY MOUTH AT BEDTIME  Dispense: 90 tablet; Refill: 0  5. Seasonal allergies Takes flonase for seasonal allergies, refill ordered. - fluticasone (FLONASE) 50 MCG/ACT nasal spray; Place 1-2 sprays into both nostrils daily as needed. For allergies.  Dispense: 48 g; Refill: 3  6. Encounter for vaccination Interested in the shingles and pneumococcal vaccine, both vaccines ordered and sent to pharmacy.  - Zoster Vaccine Adjuvanted [facility]) injection; Inject 0.5 mLs into the muscle once for 1 dose.  Dispense: 0.5 mL; Refill: 0 - pneumococcal 20-Val Conj Vacc (PREVNAR 20) 0.5 ML SUSY; Inject 0.5 mLs into the muscle once for 1 dose.  Dispense: 0.5 mL; Refill: 0   General Counseling: Renata Caprice understanding of the findings of todays visit and agrees with plan of treatment. I have discussed any further diagnostic evaluation that may be needed or ordered today. We also reviewed his medications today. he has been encouraged to call the office with any questions or concerns that should arise related to todays visit.    Orders Placed This Encounter  Procedures   POCT HgB A1C    Meds ordered this encounter  Medications   fluticasone (FLONASE) 50 MCG/ACT nasal spray    Sig: Place 1-2 sprays into both nostrils daily as  needed. For allergies.    Dispense:  48 g    Refill:  3   mirtazapine (REMERON) 7.5 MG tablet  Sig: TAKE 1 TABLET(7.5 MG) BY MOUTH AT BEDTIME    Dispense:  90 tablet    Refill:  0   Zoster Vaccine Adjuvanted [facility]) injection    Sig: Inject 0.5 mLs into the muscle once for 1 dose.    Dispense:  0.5 mL    Refill:  0   pneumococcal 20-Val Conj Vacc (PREVNAR 20) 0.5 ML SUSY    Sig: Inject 0.5 mLs into the muscle once for 1 dose.    Dispense:  0.5 mL    Refill:  0    Return in about 3 months (around 04/14/2021) for F/U, Recheck A1C, Yajaira Doffing PCP.   Total time spent:30 Minutes Time spent includes review of chart, medications, test results, and follow up plan with the patient.   [place] Controlled Substance Database was reviewed by me.  This patient was seen by Jonetta Osgood, FNP-C in collaboration with Dr. Clayborn Bigness as a part of collaborative care agreement.   Dorance Spink R. Valetta Fuller, MSN, FNP-C Internal medicine

## 2021-03-23 ENCOUNTER — Other Ambulatory Visit: Payer: Self-pay

## 2021-03-23 DIAGNOSIS — E782 Mixed hyperlipidemia: Secondary | ICD-10-CM

## 2021-03-23 MED ORDER — ESOMEPRAZOLE MAGNESIUM 40 MG PO CPDR: DELAYED_RELEASE_CAPSULE | ORAL | 1 refills | Status: DC

## 2021-03-23 MED ORDER — ROSUVASTATIN CALCIUM 5 MG PO TABS: ORAL_TABLET | ORAL | 1 refills | Status: DC

## 2021-03-28 ENCOUNTER — Other Ambulatory Visit: Payer: Self-pay

## 2021-03-28 MED ORDER — PANTOPRAZOLE SODIUM 40 MG PO TBEC
40.0000 mg | DELAYED_RELEASE_TABLET | Freq: Every day | ORAL | 3 refills | Status: DC
Start: 2021-03-28 — End: 2021-04-14

## 2021-04-05 ENCOUNTER — Other Ambulatory Visit: Payer: Self-pay

## 2021-04-05 MED ORDER — AMLODIPINE BESYLATE 10 MG PO TABS: 10.0000 mg | ORAL_TABLET | Freq: Every day | ORAL | 1 refills | Status: DC

## 2021-04-14 ENCOUNTER — Ambulatory Visit (INDEPENDENT_AMBULATORY_CARE_PROVIDER_SITE_OTHER): Payer: BC Managed Care – PPO | Admitting: Nurse Practitioner

## 2021-04-14 ENCOUNTER — Other Ambulatory Visit: Payer: Self-pay

## 2021-04-14 ENCOUNTER — Other Ambulatory Visit: Payer: Self-pay | Admitting: Nurse Practitioner

## 2021-04-14 ENCOUNTER — Encounter: Payer: Self-pay | Admitting: Nurse Practitioner

## 2021-04-14 VITALS — BP 138/100 | HR 88 | Temp 98.0°F | Resp 16 | Ht 75.0 in | Wt 238.6 lb

## 2021-04-14 DIAGNOSIS — E1165 Type 2 diabetes mellitus with hyperglycemia: Secondary | ICD-10-CM | POA: Diagnosis not present

## 2021-04-14 DIAGNOSIS — I10 Essential (primary) hypertension: Secondary | ICD-10-CM | POA: Diagnosis not present

## 2021-04-14 DIAGNOSIS — M722 Plantar fascial fibromatosis: Secondary | ICD-10-CM | POA: Diagnosis not present

## 2021-04-14 DIAGNOSIS — K219 Gastro-esophageal reflux disease without esophagitis: Secondary | ICD-10-CM

## 2021-04-14 LAB — POCT GLYCOSYLATED HEMOGLOBIN (HGB A1C): Hemoglobin A1C: 8.3 % — AB (ref 4.0–5.6)

## 2021-04-14 MED ORDER — MELOXICAM 15 MG PO TABS
15.0000 mg | ORAL_TABLET | Freq: Every day | ORAL | 1 refills | Status: DC
Start: 2021-04-14 — End: 2023-09-13

## 2021-04-14 MED ORDER — ESOMEPRAZOLE MAGNESIUM 40 MG PO CPDR: 40.0000 mg | DELAYED_RELEASE_CAPSULE | Freq: Every day | ORAL | 1 refills | Status: DC

## 2021-04-14 MED ORDER — TRAMADOL HCL 50 MG PO TABS: ORAL_TABLET | ORAL | 1 refills | Status: DC

## 2021-04-14 MED ORDER — HYDROCHLOROTHIAZIDE 12.5 MG PO TABS: 12.5000 mg | ORAL_TABLET | Freq: Every day | ORAL | 1 refills | Status: DC

## 2021-04-14 NOTE — Progress Notes (Signed)
[facility] [place], [place] [address]  Internal MEDICINE  Office Visit Note  Patient Name: Clinton Moore  [id number]  [id number]  Date of Service: 04/14/2021  Chief Complaint  Patient presents with   Follow-up    refills   Hypertension   Diabetes    HPI Kevinmichael presents for a follow up visit for hypertension, diabetes and A1C check. A1C is 8.3 today which is an improvement from 8.8 in June. Blood pressure is elevated even when rechecked, see vitals. Asking for refills of medications for foot pain.    Current Medication: Outpatient Encounter Medications as of 04/14/2021  Medication Sig   amLODipine (NORVASC) 10 MG tablet Take 1 tablet (10 mg total) by mouth daily.   cyclobenzaprine (FLEXERIL) 10 MG tablet Take one tab po qd as needed for back spasm   dapagliflozin propanediol (FARXIGA) 10 MG TABS tablet Take 1 tablet (10 mg total) by mouth daily before breakfast.   fluticasone (FLONASE) 50 MCG/ACT nasal spray Place 1-2 sprays into both nostrils daily as needed. For allergies.   glimepiride (AMARYL) 4 MG tablet Take one tab by mouth daily   glucose blood (ACCU-CHEK GUIDE) test strip Use as instructed   hydrochlorothiazide (HYDRODIURIL) 12.5 MG tablet Take 1 tablet (12.5 mg total) by mouth daily.   Lancets (ACCU-CHEK MULTICLIX) lancets Use as instructed   Melatonin-Pyridoxine 5-1 MG TABS Take 1 tablet by mouth at bedtime as needed. For sleep.   meloxicam (MOBIC) 15 MG tablet Take 1 tablet (15 mg total) by mouth daily.   mirtazapine (REMERON) 7.5 MG tablet TAKE 1 TABLET(7.5 MG) BY MOUTH AT BEDTIME   montelukast (SINGULAIR) 10 MG tablet Take 1 tablet (10 mg total) by mouth at bedtime.   Multiple Vitamin (MULTIVITAMIN WITH MINERALS) TABS tablet Take 1 tablet by mouth daily. men multivitamin   oxyCODONE-acetaminophen (PERCOCET) 7.5-325 MG tablet Take 1 tablet by mouth every 6 (six) hours as needed for severe pain.   quinapril (ACCUPRIL) 20 MG tablet Take  1 tablet (20 mg total) by mouth daily.   rosuvastatin (CRESTOR) 5 MG tablet Take one tab 2 x a week for high chol   [DISCONTINUED] esomeprazole (NEXIUM) 40 MG capsule Take 1 capsule (40 mg total) by mouth daily.   [DISCONTINUED] Esomeprazole Magnesium (NEXIUM PO) Take 40 mg by mouth.   [DISCONTINUED] etodolac (LODINE) 400 MG tablet Take 1 tablet (400 mg total) by mouth 2 (two) times daily.   [DISCONTINUED] meloxicam (MOBIC) 15 MG tablet TAKE 1 TABLET DAILY AS NEEDED FOR PAIN   [DISCONTINUED] traMADol (ULTRAM) 50 MG tablet Take 1 tablet po BID prn pain   traMADol (ULTRAM) 50 MG tablet Take 1 tablet po BID prn pain   [DISCONTINUED] pantoprazole (PROTONIX) 40 MG tablet Take 1 tablet (40 mg total) by mouth daily. (Patient not taking: Reported on 04/14/2021)   No facility-administered encounter medications on file as of 04/14/2021.    Surgical History: Past Surgical History:  Procedure Laterality Date   FOOT MASS EXCISION Right 2007   PLANTAR FASCIA RELEASE Left 06/09/2016   Procedure: Radical excision plantar fibromas of left plantar arch and left hallux;  Surgeon: Sharlotte Alamo, DPM;  Location: [facility] ORS;  Service: Podiatry;  Laterality: Left;    Medical History: Past Medical History:  Diagnosis Date   Anxiety    Depression    Diabetes mellitus without complication (HCC)    GERD (gastroesophageal reflux disease)    Headache    history of migraines   Hypertension  Family History: Family History  Problem Relation Age of Onset   Diabetes Father    Hypertension Father     Social History   Socioeconomic History   Marital status: Married    Spouse name: Not on file   Number of children: Not on file   Years of education: Not on file   Highest education level: Not on file  Occupational History   Not on file  Tobacco Use   Smoking status: Some Days    Types: Cigarettes   Smokeless tobacco: Never   Tobacco comments:    social smoker   Substance and Sexual Activity   Alcohol  use: Yes    Alcohol/week: 3.0 standard drinks    Types: 3 Cans of beer per week   Drug use: No   Sexual activity: Never  Other Topics Concern   Not on file  Social History Narrative   Not on file   Social Determinants of Health   Financial Resource Strain: Not on file  Food Insecurity: Not on file  Transportation Needs: Not on file  Physical Activity: Not on file  Stress: Not on file  Social Connections: Not on file  Intimate Partner Violence: Not on file      Review of Systems  Constitutional:  Negative for chills, fatigue and unexpected weight change.  HENT:  Negative for congestion, rhinorrhea, sneezing and sore throat.   Eyes:  Negative for redness.  Respiratory:  Negative for cough, chest tightness and shortness of breath.   Cardiovascular:  Negative for chest pain and palpitations.  Gastrointestinal:  Negative for abdominal pain, constipation, diarrhea, nausea and vomiting.  Genitourinary:  Negative for dysuria and frequency.  Musculoskeletal:  Negative for arthralgias, back pain, joint swelling and neck pain.  Skin:  Negative for rash.  Neurological: Negative.  Negative for tremors and numbness.  Hematological:  Negative for adenopathy. Does not bruise/bleed easily.  Psychiatric/Behavioral:  Negative for behavioral problems (Depression), sleep disturbance and suicidal ideas. The patient is not nervous/anxious.    Vital Signs: BP (!) 138/100 Comment: 145/104  Pulse 88   Temp 98 F (36.7 C)   Resp 16   Ht '6\' 3"'$  (1.905 m)   Wt 238 lb 9.6 oz (108.2 kg)   SpO2 97%   BMI 29.82 kg/m    Physical Exam Vitals reviewed.  Constitutional:      General: He is not in acute distress.    Appearance: Normal appearance. He is well-developed. He is obese. He is not ill-appearing or diaphoretic.  HENT:     Head: Normocephalic and atraumatic.  Neck:     Thyroid: No thyromegaly.     Vascular: No JVD.     Trachea: No tracheal deviation.  Cardiovascular:     Rate and  Rhythm: Normal rate and regular rhythm.     Pulses: Normal pulses.     Heart sounds: Normal heart sounds. No murmur heard.   No friction rub. No gallop.  Pulmonary:     Effort: Pulmonary effort is normal. No respiratory distress.     Breath sounds: Normal breath sounds. No wheezing or rales.  Chest:     Chest wall: No tenderness.  Skin:    General: Skin is warm and dry.     Capillary Refill: Capillary refill takes less than 2 seconds.  Neurological:     Mental Status: He is alert and oriented to person, place, and time.  Psychiatric:        Mood and Affect: Mood normal.  Behavior: Behavior normal.       Assessment/Plan: 1. Essential hypertension Start hydrochlorothiazide  12.5 mg daily, follow up in 4 weeks.  - hydrochlorothiazide (HYDRODIURIL) 12.5 MG tablet; Take 1 tablet (12.5 mg total) by mouth daily.  Dispense: 30 tablet; Refill: 1  2. Uncontrolled type 2 diabetes mellitus with hyperglycemia (HCC) A1C is improving, continue current medications as prescribed - POCT HgB A1C  3. Plantar fascial fibromatosis [facility] refilled.  - traMADol (ULTRAM) 50 MG tablet; Take 1 tablet po BID prn pain  Dispense: 180 tablet; Refill: 1 - meloxicam (MOBIC) 15 MG tablet; Take 1 tablet (15 mg total) by mouth daily.  Dispense: 90 tablet; Refill: 1  4. Gastroesophageal reflux disease without esophagitis Nexium prescribed.   General Counseling: manvir camp understanding of the findings of todays visit and agrees with plan of treatment. I have discussed any further diagnostic evaluation that may be needed or ordered today. We also reviewed his medications today. he has been encouraged to call the office with any questions or concerns that should arise related to todays visit.    Orders Placed This Encounter  Procedures   POCT HgB A1C    Meds ordered this encounter  Medications   DISCONTD: esomeprazole (NEXIUM) 40 MG capsule    Sig: Take 1 capsule (40 mg total) by mouth  daily.    Dispense:  90 capsule    Refill:  1   traMADol (ULTRAM) 50 MG tablet    Sig: Take 1 tablet po BID prn pain    Dispense:  180 tablet    Refill:  1   meloxicam (MOBIC) 15 MG tablet    Sig: Take 1 tablet (15 mg total) by mouth daily.    Dispense:  90 tablet    Refill:  1   hydrochlorothiazide (HYDRODIURIL) 12.5 MG tablet    Sig: Take 1 tablet (12.5 mg total) by mouth daily.    Dispense:  30 tablet    Refill:  1    Return in about 4 weeks (around 05/12/2021) for F/U, BP check, Kerman Pfost PCP.   Total time spent:20 Minutes Time spent includes review of chart, medications, test results, and follow up plan with the patient.   [place] Controlled Substance Database was reviewed by me.  This patient was seen by Jonetta Osgood, FNP-C in collaboration with Dr. Clayborn Bigness as a part of collaborative care agreement.   Jaquell Seddon R. Valetta Fuller, MSN, FNP-C Internal medicine

## 2021-04-27 ENCOUNTER — Other Ambulatory Visit: Payer: Self-pay

## 2021-04-27 MED ORDER — ESOMEPRAZOLE MAGNESIUM 40 MG PO CPDR: 40.0000 mg | DELAYED_RELEASE_CAPSULE | Freq: Every day | ORAL | 1 refills | Status: DC

## 2021-04-27 NOTE — Telephone Encounter (Signed)
Spoke to pt about PA on ESOMEPRAZOLE 40 mg and he asked to have it sent to [facility] bc he always has gotten the prescription from them.  I sent the prescription to [facility].  Pt will let me know or have the pharmacy to send [place] information if they require  a PA

## 2021-05-04 ENCOUNTER — Encounter: Payer: BC Managed Care – PPO | Admitting: Hospice and Palliative Medicine

## 2021-05-09 ENCOUNTER — Encounter: Payer: BC Managed Care – PPO | Admitting: Physician Assistant

## 2021-05-18 ENCOUNTER — Encounter: Payer: Self-pay | Admitting: Nurse Practitioner

## 2021-05-18 ENCOUNTER — Other Ambulatory Visit: Payer: Self-pay

## 2021-05-18 ENCOUNTER — Ambulatory Visit (INDEPENDENT_AMBULATORY_CARE_PROVIDER_SITE_OTHER): Payer: BC Managed Care – PPO | Admitting: Nurse Practitioner

## 2021-05-18 VITALS — BP 116/90 | HR 100 | Temp 98.6°F | Resp 16 | Ht 75.0 in | Wt 240.2 lb

## 2021-05-18 DIAGNOSIS — M722 Plantar fascial fibromatosis: Secondary | ICD-10-CM | POA: Diagnosis not present

## 2021-05-18 DIAGNOSIS — R300 Dysuria: Secondary | ICD-10-CM

## 2021-05-18 DIAGNOSIS — E782 Mixed hyperlipidemia: Secondary | ICD-10-CM

## 2021-05-18 DIAGNOSIS — E1165 Type 2 diabetes mellitus with hyperglycemia: Secondary | ICD-10-CM

## 2021-05-18 DIAGNOSIS — Z0001 Encounter for general adult medical examination with abnormal findings: Secondary | ICD-10-CM | POA: Diagnosis not present

## 2021-05-18 DIAGNOSIS — I10 Essential (primary) hypertension: Secondary | ICD-10-CM | POA: Diagnosis not present

## 2021-05-18 MED ORDER — DULAGLUTIDE 3 MG/0.5ML ~~LOC~~ SOAJ: 3.0000 mg | SUBCUTANEOUS | 2 refills | Status: DC

## 2021-05-18 NOTE — Progress Notes (Signed)
Annual physical exam   Patient: Clinton Moore   DOB: 1965-08-01   55 y.o. Male  MRN: [id number] Visit Date: 05/18/2021  Today's healthcare provider: Jonetta Osgood, NP  Subjective:    Chief Complaint  Patient presents with   Annual Exam    Left shoulder hurts off and on for that last month   Depression   Diabetes   Hypertension    Clinton Moore is a 55 y.o. male who presents today for a Annual physical exam. HPI  Clinton Moore has hypertension, GERD, diabetes and hyperlipidemia. His A1C was checked in September and it was 8.3 which is still elevated but was improved from previous A1C of 8.8. At his previous office visit, he was started on 12.5 mg of hydrochlorothiazide which has improved his blood pressure some. He continues to use nexium for acid reflux which is adequately controlled. For his blood pressure, he also takes quinapril, and amlodipine.He also takes rosuvastatin for elevated cholesterol. For his diabetes, he is taking farxiga, trulicity, and glimepiride. He continues to smoke approx 4 cigarettes per day and is not ready to quit. He drink occasional alcohol a couple of times per month and denies recreational drug use.  He has been having left shoulder pain off and on for a month and initially thought he had a torn rotator cuff but now thinks it is probably just arthritis. He reports some decreased ROM, He describes the pain as dull and achy, no more than a 5/10.      Past Medical History:  Diagnosis Date   Anxiety    Depression    Diabetes mellitus without complication ([place])    GERD (gastroesophageal reflux disease)    Headache    history of migraines   Hypertension    Past Surgical History:  Procedure Laterality Date   FOOT MASS EXCISION Right 2007   PLANTAR FASCIA RELEASE Left 06/09/2016   Procedure: Radical excision plantar fibromas of left plantar arch and left hallux;  Surgeon: Sharlotte Alamo, DPM;  Location: [facility] ORS;  Service: Podiatry;  Laterality:  Left;   Social History   Socioeconomic History   Marital status: Married    Spouse name: Not on file   Number of children: Not on file   Years of education: Not on file   Highest education level: Not on file  Occupational History   Not on file  Tobacco Use   Smoking status: Some Days    Types: Cigarettes   Smokeless tobacco: Never   Tobacco comments:    social smoker   Substance and Sexual Activity   Alcohol use: Yes    Alcohol/week: 3.0 standard drinks    Types: 3 Cans of beer per week   Drug use: No   Sexual activity: Never  Other Topics Concern   Not on file  Social History Narrative   Not on file   Social Determinants of Health   Financial Resource Strain: Not on file  Food Insecurity: Not on file  Transportation Needs: Not on file  Physical Activity: Not on file  Stress: Not on file  Social Connections: Not on file  Intimate Partner Violence: Not on file   Family Status  Relation Name Status   Father  (Not Specified)   Family History  Problem Relation Age of Onset   Diabetes Father    Hypertension Father    No Known Allergies  Patient Care Team: Lavera Guise, MD as PCP - General (Internal Medicine)   Medications:  Outpatient Medications Prior to Visit  Medication Sig   amLODipine (NORVASC) 10 MG tablet Take 1 tablet (10 mg total) by mouth daily.   cyclobenzaprine (FLEXERIL) 10 MG tablet Take one tab po qd as needed for back spasm   dapagliflozin propanediol (FARXIGA) 10 MG TABS tablet Take 1 tablet (10 mg total) by mouth daily before breakfast.   esomeprazole (NEXIUM) 40 MG capsule Take 1 capsule (40 mg total) by mouth daily.   fluticasone (FLONASE) 50 MCG/ACT nasal spray Place 1-2 sprays into both nostrils daily as needed. For allergies.   glimepiride (AMARYL) 4 MG tablet Take one tab by mouth daily   glucose blood (ACCU-CHEK GUIDE) test strip Use as instructed   Lancets (ACCU-CHEK MULTICLIX) lancets Use as instructed   Melatonin-Pyridoxine 5-1 MG  TABS Take 1 tablet by mouth at bedtime as needed. For sleep.   meloxicam (MOBIC) 15 MG tablet Take 1 tablet (15 mg total) by mouth daily.   montelukast (SINGULAIR) 10 MG tablet Take 1 tablet (10 mg total) by mouth at bedtime.   Multiple Vitamin (MULTIVITAMIN WITH MINERALS) TABS tablet Take 1 tablet by mouth daily. men multivitamin   oxyCODONE-acetaminophen (PERCOCET) 7.5-325 MG tablet Take 1 tablet by mouth every 6 (six) hours as needed for severe pain.   quinapril (ACCUPRIL) 20 MG tablet Take 1 tablet (20 mg total) by mouth daily.   rosuvastatin (CRESTOR) 5 MG tablet Take one tab 2 x a week for high chol   traMADol (ULTRAM) 50 MG tablet Take 1 tablet po BID prn pain   [DISCONTINUED] hydrochlorothiazide (HYDRODIURIL) 12.5 MG tablet Take 1 tablet (12.5 mg total) by mouth daily.   [DISCONTINUED] mirtazapine (REMERON) 7.5 MG tablet TAKE 1 TABLET(7.5 MG) BY MOUTH AT BEDTIME   No facility-administered medications prior to visit.    Review of Systems  Constitutional:  Negative for activity change, appetite change, chills, fatigue, fever and unexpected weight change.  HENT: Negative.  Negative for congestion, ear pain, rhinorrhea, sore throat and trouble swallowing.   Eyes: Negative.   Respiratory: Negative.  Negative for cough, chest tightness, shortness of breath and wheezing.   Cardiovascular: Negative.  Negative for chest pain and palpitations.  Gastrointestinal: Negative.  Negative for abdominal pain, blood in stool, constipation, diarrhea, nausea and vomiting.  Endocrine: Negative.   Genitourinary: Negative.  Negative for difficulty urinating, dysuria, frequency, hematuria and urgency.  Musculoskeletal: Negative.  Negative for arthralgias, back pain, joint swelling, myalgias and neck pain.  Skin: Negative.  Negative for rash and wound.  Allergic/Immunologic: Negative.  Negative for immunocompromised state.  Neurological: Negative.  Negative for dizziness, seizures, numbness and headaches.   Hematological: Negative.   Psychiatric/Behavioral: Negative.  Negative for behavioral problems, self-injury and suicidal ideas. The patient is not nervous/anxious.    Last CBC Lab Results  Component Value Date   WBC 5.1 05/11/2020   HGB 16.7 05/11/2020   HCT 49.9 05/11/2020   MCV 83 05/11/2020   MCH 27.7 05/11/2020   RDW 16.2 (H) 05/11/2020   PLT 237 85/63/1497   Last metabolic panel Lab Results  Component Value Date   GLUCOSE 141 (H) 05/11/2020   NA 141 05/11/2020   K 4.2 05/11/2020   CL 106 05/11/2020   CO2 20 05/11/2020   BUN 14 05/11/2020   CREATININE 1.11 05/11/2020   GFRNONAA 76 05/11/2020   CALCIUM 9.5 05/11/2020   PROT 7.3 05/11/2020   ALBUMIN 4.8 05/11/2020   LABGLOB 2.5 05/11/2020   AGRATIO 1.9 05/11/2020   BILITOT 0.4 05/11/2020  ALKPHOS 99 05/11/2020   AST 23 05/11/2020   ALT 34 05/11/2020   ANIONGAP 9 06/05/2016   Last lipids Lab Results  Component Value Date   CHOL 195 05/11/2020   HDL 41 05/11/2020   LDLCALC 137 (H) 05/11/2020   TRIG 95 05/11/2020   Last hemoglobin A1c Lab Results  Component Value Date   HGBA1C 8.3 (A) 04/14/2021   Last thyroid functions Lab Results  Component Value Date   TSH 1.430 05/11/2020   Last vitamin D No results found for: 25OHVITD2, 25OHVITD3, VD25OH Last vitamin B12 and Folate No results found for: VITAMINB12, FOLATE      Objective:    BP 116/90   Pulse 100   Temp 98.6 F (37 C)   Resp 16   Ht _0  (1.905 m)   Wt 240 lb 3.2 oz (109 kg)   SpO2 99%   BMI 30.02 kg/m    Physical Exam Vitals reviewed.  Constitutional:      General: He is awake. He is not in acute distress.    Appearance: Normal appearance. He is well-developed and well-groomed. He is obese. He is not ill-appearing or diaphoretic.  HENT:     Head: Normocephalic and atraumatic.     Right Ear: Tympanic membrane, ear canal and external ear normal.     Left Ear: Tympanic membrane, ear canal and external ear normal.     Nose: Nose  normal. No congestion or rhinorrhea.     Mouth/Throat:     Lips: Pink.     Mouth: Mucous membranes are moist.     Pharynx: Oropharynx is clear. Uvula midline. No oropharyngeal exudate or posterior oropharyngeal erythema.  Eyes:     General: Lids are normal. Vision grossly intact. Gaze aligned appropriately. No scleral icterus.       Right eye: No discharge.        Left eye: No discharge.     Extraocular Movements: Extraocular movements intact.     Conjunctiva/sclera: Conjunctivae normal.     Pupils: Pupils are equal, round, and reactive to light.     Funduscopic exam:    Right eye: Red reflex present.        Left eye: Red reflex present. Neck:     Thyroid: No thyromegaly.     Vascular: No JVD.     Trachea: Trachea and phonation normal. No tracheal deviation.  Cardiovascular:     Rate and Rhythm: Normal rate and regular rhythm.     Pulses:          Carotid pulses are 3+ on the right side and 3+ on the left side.      Radial pulses are 2+ on the right side and 2+ on the left side.       Dorsalis pedis pulses are 2+ on the right side and 2+ on the left side.       Posterior tibial pulses are 2+ on the right side and 2+ on the left side.     Heart sounds: Normal heart sounds, S1 normal and S2 normal. No murmur heard.   No friction rub. No gallop.  Pulmonary:     Effort: Pulmonary effort is normal. No accessory muscle usage or respiratory distress.     Breath sounds: Normal breath sounds and air entry. No stridor. No wheezing or rales.  Chest:     Chest wall: No tenderness.  Abdominal:     General: Bowel sounds are normal. There is no distension.  Palpations: Abdomen is soft. There is no shifting dullness, fluid wave, mass or pulsatile mass.     Tenderness: There is no abdominal tenderness. There is no guarding or rebound.  Musculoskeletal:        General: No tenderness or deformity. Normal range of motion.     Cervical back: Normal range of motion and neck supple.     Right  foot: Normal range of motion. No deformity, bunion, Charcot foot, foot drop or prominent metatarsal heads.     Left foot: Normal range of motion. No deformity, bunion, Charcot foot, foot drop or prominent metatarsal heads.  Feet:     Right foot:     Protective Sensation: 6 sites tested.  6 sites sensed.     Skin integrity: Dry skin present. No ulcer, blister, skin breakdown, erythema, warmth, callus or fissure.     Toenail Condition: Right toenails are long.     Left foot:     Protective Sensation: 6 sites tested.  6 sites sensed.     Skin integrity: Dry skin present. No ulcer, blister, skin breakdown, erythema, warmth, callus or fissure.     Toenail Condition: Left toenails are long.  Lymphadenopathy:     Cervical: No cervical adenopathy.  Skin:    General: Skin is warm and dry.     Capillary Refill: Capillary refill takes less than 2 seconds.     Coloration: Skin is not pale.     Findings: No erythema or rash.  Neurological:     Mental Status: He is alert and oriented to person, place, and time.     Cranial Nerves: No cranial nerve deficit.     Motor: No abnormal muscle tone.     Coordination: Coordination normal.     Gait: Gait normal.     Deep Tendon Reflexes: Reflexes are normal and symmetric.  Psychiatric:        Mood and Affect: Mood and affect normal.        Speech: Speech normal.        Behavior: Behavior normal. Behavior is cooperative.        Thought Content: Thought content normal.        Judgment: Judgment normal.      Depression Screen  PHQ 2/9 Scores 04/14/2021 01/12/2021 10/14/2020  PHQ - 2 Score 0 0 0    Results for orders placed or performed in visit on 05/18/21  Microscopic Examination   Urine  Result Value Ref Range   WBC, UA None seen 0 - 5 /hpf   RBC None seen 0 - 2 /hpf   Epithelial Cells (non renal) None seen 0 - 10 /hpf   Casts None seen None seen /lpf   Bacteria, UA None seen None seen/Few  UA/M w/rflx Culture, Routine   Specimen: Urine   Urine   Result Value Ref Range   Specific Gravity, UA 1.022 1.005 - 1.030   pH, UA 5.0 5.0 - 7.5   Color, UA Yellow Yellow   Appearance Ur Clear Clear   Leukocytes,UA Negative Negative   Protein,UA Negative Negative/Trace   Glucose, UA 3+ (A) Negative   Ketones, UA Negative Negative   RBC, UA Negative Negative   Bilirubin, UA Negative Negative   Urobilinogen, Ur 0.2 0.2 - 1.0 mg/dL   Nitrite, UA Negative Negative   Microscopic Examination Comment    Microscopic Examination See below:    Urinalysis Reflex Comment       Assessment & Plan:    Routine  Health Maintenance and Physical Exam  Exercise Activities and Dietary recommendations  Goals   None     Immunization History  Administered Date(s) Administered   Moderna SARS-COV2 Booster Vaccination 07/14/2020   Moderna Sars-Covid-2 Vaccination 10/20/2019, 11/28/2019    Health Maintenance  Topic Date Due   Pneumococcal Vaccine 15-53 Years old (1 - PCV) Never done   OPHTHALMOLOGY EXAM  01/08/2020   COVID-19 Vaccine (3 - Moderna risk series) 08/11/2020   INFLUENZA VACCINE  10/28/2021 (Originally 02/28/2021)   TETANUS/TDAP  04/14/2022 (Originally 05/15/1986)   Zoster Vaccines- Shingrix (1 of 2) 04/14/2022 (Originally 05/15/1986)   HEMOGLOBIN A1C  10/12/2021   FOOT EXAM  05/18/2022   COLONOSCOPY (Pts 45-63yr Insurance coverage will need to be confirmed)  07/12/2023   HPV VACCINES  Aged Out   Hepatitis C Screening  Discontinued   HIV Screening  Discontinued    Discussed health benefits of physical activity, and encouraged him to engage in regular exercise appropriate for his age and condition.   1. Encounter for general adult medical examination with abnormal findings Age-appropriate preventive screenings and vaccinations discussed, annual physical exam completed. Routine labs for health maintenance ordered, see below. PHM updated.   2. Uncontrolled type 2 diabetes mellitus with hyperglycemia ([place] Patient is working on diet  and lifestyle modifications as previously discussed. Routine labs ordered, trulicity refill ordered.  - CBC with Differential/Platelet - CMP14+EGFR - Dulaglutide 3 MG/0.5ML SOPN; Inject 3 mg into the skin once a week.  Dispense: 6 mL; Refill: 2  3. Essential hypertension Stable with current medications.   4. Plantar fascial fibromatosis Followed by podiatry  5. Mixed hyperlipidemia Repeat lipid panel. Continue rosuvastatin - Lipid Profile  6. Dysuria Routine urinalysis done - UA/M w/rflx Culture, Routine - Microscopic Examination  Orders Placed This Encounter  Procedures   Microscopic Examination   UA/M w/rflx Culture, Routine   CBC with Differential/Platelet   CMP14+EGFR   Lipid Profile   Meds ordered this encounter  Medications   Dulaglutide 3 MG/0.5ML SOPN    Sig: Inject 3 mg into the skin once a week.    Dispense:  6 mL    Refill:  2    Patient does not need refill now, he has 2 months left, prescription order fell off med list is system.      Return in about 2 months (around 07/18/2021) for F/U, Recheck A1C, Lilliane Sposito PCP.  Time: 30 minutes Time spent with patient included reviewing progress notes, labs, imaging studies, and discussing plan for follow up.   [place] Controlled Substance Database was reviewed by me for overdose risk score (ORS)  This patient was seen by AJonetta Osgood FNP-C in collaboration with Dr. FClayborn Bignessas a part of collaborative care agreement.  AJonetta Osgood MSN, FMidwest Surgery CenterInternal Medicine  [facility] [place](phone) [phone](fax)

## 2021-05-19 ENCOUNTER — Encounter: Payer: BC Managed Care – PPO | Admitting: Physician Assistant

## 2021-05-19 LAB — MICROSCOPIC EXAMINATION
Bacteria, UA: NONE SEEN
Casts: NONE SEEN /lpf
Epithelial Cells (non renal): NONE SEEN /hpf (ref 0–10)
RBC, Urine: NONE SEEN /hpf (ref 0–2)
WBC, UA: NONE SEEN /hpf (ref 0–5)

## 2021-05-19 LAB — UA/M W/RFLX CULTURE, ROUTINE
Bilirubin, UA: NEGATIVE
Ketones, UA: NEGATIVE
Leukocytes,UA: NEGATIVE
Nitrite, UA: NEGATIVE
Protein,UA: NEGATIVE
RBC, UA: NEGATIVE
Specific Gravity, UA: 1.022 (ref 1.005–1.030)
Urobilinogen, Ur: 0.2 mg/dL (ref 0.2–1.0)
pH, UA: 5 (ref 5.0–7.5)

## 2021-06-01 ENCOUNTER — Other Ambulatory Visit: Payer: Self-pay | Admitting: Nurse Practitioner

## 2021-06-01 DIAGNOSIS — G4700 Insomnia, unspecified: Secondary | ICD-10-CM

## 2021-06-03 ENCOUNTER — Other Ambulatory Visit: Payer: Self-pay

## 2021-06-03 DIAGNOSIS — I10 Essential (primary) hypertension: Secondary | ICD-10-CM

## 2021-06-03 MED ORDER — HYDROCHLOROTHIAZIDE 12.5 MG PO TABS
12.5000 mg | ORAL_TABLET | Freq: Every day | ORAL | 1 refills | Status: DC
Start: 2021-06-03 — End: 2021-08-30

## 2021-07-20 ENCOUNTER — Ambulatory Visit: Payer: BC Managed Care – PPO | Admitting: Nurse Practitioner

## 2021-07-24 ENCOUNTER — Other Ambulatory Visit: Payer: Self-pay | Admitting: Internal Medicine

## 2021-07-24 DIAGNOSIS — E1165 Type 2 diabetes mellitus with hyperglycemia: Secondary | ICD-10-CM

## 2021-08-15 ENCOUNTER — Other Ambulatory Visit: Payer: Self-pay

## 2021-08-15 DIAGNOSIS — E1165 Type 2 diabetes mellitus with hyperglycemia: Secondary | ICD-10-CM

## 2021-08-15 MED ORDER — TRULICITY 3 MG/0.5ML ~~LOC~~ SOAJ: SUBCUTANEOUS | 0 refills | Status: DC

## 2021-08-22 ENCOUNTER — Other Ambulatory Visit: Payer: Self-pay

## 2021-08-22 DIAGNOSIS — I10 Essential (primary) hypertension: Secondary | ICD-10-CM

## 2021-08-22 MED ORDER — QUINAPRIL HCL 20 MG PO TABS: 20.0000 mg | ORAL_TABLET | Freq: Every day | ORAL | 0 refills | Status: DC

## 2021-08-30 ENCOUNTER — Ambulatory Visit (INDEPENDENT_AMBULATORY_CARE_PROVIDER_SITE_OTHER): Payer: BC Managed Care – PPO | Admitting: Nurse Practitioner

## 2021-08-30 ENCOUNTER — Encounter: Payer: Self-pay | Admitting: Nurse Practitioner

## 2021-08-30 ENCOUNTER — Other Ambulatory Visit: Payer: Self-pay

## 2021-08-30 VITALS — BP 140/90 | HR 90 | Temp 98.6°F | Resp 16 | Ht 75.0 in | Wt 238.0 lb

## 2021-08-30 DIAGNOSIS — M549 Dorsalgia, unspecified: Secondary | ICD-10-CM | POA: Diagnosis not present

## 2021-08-30 DIAGNOSIS — E1165 Type 2 diabetes mellitus with hyperglycemia: Secondary | ICD-10-CM

## 2021-08-30 DIAGNOSIS — I10 Essential (primary) hypertension: Secondary | ICD-10-CM

## 2021-08-30 DIAGNOSIS — G8929 Other chronic pain: Secondary | ICD-10-CM | POA: Diagnosis not present

## 2021-08-30 LAB — POCT GLYCOSYLATED HEMOGLOBIN (HGB A1C): Hemoglobin A1C: 12 % — AB (ref 4.0–5.6)

## 2021-08-30 MED ORDER — OXYCODONE-ACETAMINOPHEN 7.5-325 MG PO TABS: 1.0000 | ORAL_TABLET | Freq: Four times a day (QID) | ORAL | 0 refills | Status: DC | PRN

## 2021-08-30 MED ORDER — DAPAGLIFLOZIN PROPANEDIOL 10 MG PO TABS: 10.0000 mg | ORAL_TABLET | Freq: Every day | ORAL | 1 refills | Status: DC

## 2021-08-30 MED ORDER — GLIMEPIRIDE 4 MG PO TABS: ORAL_TABLET | ORAL | 1 refills | Status: DC

## 2021-08-30 MED ORDER — HYDROCHLOROTHIAZIDE 12.5 MG PO TABS: 12.5000 mg | ORAL_TABLET | Freq: Every day | ORAL | 1 refills | Status: DC

## 2021-08-30 MED ORDER — QUINAPRIL HCL 20 MG PO TABS: 20.0000 mg | ORAL_TABLET | Freq: Every day | ORAL | 0 refills | Status: DC

## 2021-08-30 MED ORDER — AMLODIPINE BESYLATE 10 MG PO TABS: 10.0000 mg | ORAL_TABLET | Freq: Every day | ORAL | 1 refills | Status: DC

## 2021-08-30 MED ORDER — TRULICITY 3 MG/0.5ML ~~LOC~~ SOAJ: SUBCUTANEOUS | 1 refills | Status: DC

## 2021-08-30 MED ORDER — CYCLOBENZAPRINE HCL 10 MG PO TABS: ORAL_TABLET | ORAL | 1 refills | Status: DC

## 2021-08-30 NOTE — Progress Notes (Signed)
[facility] [address], [place] [address]  Internal MEDICINE  Office Visit Note  Patient Name: Clinton Moore  [id number]  [id number]  Date of Service: 08/30/2021  Chief Complaint  Patient presents with   Follow-up   Diabetes   Hypertension   Depression   Anxiety   Gastroesophageal Reflux    HPI Clinton Moore presents for a follow up visit for diabetes and hypertension. He is due for A1C today. He had run out of his medications and was not able to get them for several weeks.  His A1C is 12.0 today which is significantly elevated compared to 8.3 in September.  His diet has also not been ideal and there are some areas he can improve it.    Current Medication: Outpatient Encounter Medications as of 08/30/2021  Medication Sig   esomeprazole (NEXIUM) 40 MG capsule Take 1 capsule (40 mg total) by mouth daily.   fluticasone (FLONASE) 50 MCG/ACT nasal spray Place 1-2 sprays into both nostrils daily as needed. For allergies.   glucose blood (ACCU-CHEK GUIDE) test strip Use as instructed   Lancets (ACCU-CHEK MULTICLIX) lancets Use as instructed   Melatonin-Pyridoxine 5-1 MG TABS Take 1 tablet by mouth at bedtime as needed. For sleep.   meloxicam (MOBIC) 15 MG tablet Take 1 tablet (15 mg total) by mouth daily.   mirtazapine (REMERON) 7.5 MG tablet TAKE 1 TABLET AT BEDTIME   montelukast (SINGULAIR) 10 MG tablet Take 1 tablet (10 mg total) by mouth at bedtime.   Multiple Vitamin (MULTIVITAMIN WITH MINERALS) TABS tablet Take 1 tablet by mouth daily. men multivitamin   rosuvastatin (CRESTOR) 5 MG tablet Take one tab 2 x a week for high chol   traMADol (ULTRAM) 50 MG tablet Take 1 tablet po BID prn pain   [DISCONTINUED] amLODipine (NORVASC) 10 MG tablet Take 1 tablet (10 mg total) by mouth daily.   [DISCONTINUED] cyclobenzaprine (FLEXERIL) 10 MG tablet Take one tab po qd as needed for back spasm   [DISCONTINUED] dapagliflozin propanediol (FARXIGA) 10 MG TABS tablet Take  1 tablet (10 mg total) by mouth daily before breakfast.   [DISCONTINUED] Dulaglutide (TRULICITY) 3 LK/4.4WN SOPN INJECT 3 MG AS DIRECTED ONCE A WEEK   [DISCONTINUED] glimepiride (AMARYL) 4 MG tablet Take one tab by mouth daily   [DISCONTINUED] hydrochlorothiazide (HYDRODIURIL) 12.5 MG tablet Take 1 tablet (12.5 mg total) by mouth daily.   [DISCONTINUED] oxyCODONE-acetaminophen (PERCOCET) 7.5-325 MG tablet Take 1 tablet by mouth every 6 (six) hours as needed for severe pain.   [DISCONTINUED] quinapril (ACCUPRIL) 20 MG tablet Take 1 tablet (20 mg total) by mouth daily.   amLODipine (NORVASC) 10 MG tablet Take 1 tablet (10 mg total) by mouth daily.   cyclobenzaprine (FLEXERIL) 10 MG tablet Take one tab po qd as needed for back spasm   dapagliflozin propanediol (FARXIGA) 10 MG TABS tablet Take 1 tablet (10 mg total) by mouth daily before breakfast.   Dulaglutide (TRULICITY) 3 UU/7.2ZD SOPN INJECT 3 MG AS DIRECTED ONCE A WEEK   glimepiride (AMARYL) 4 MG tablet Take one tab by mouth daily   hydrochlorothiazide (HYDRODIURIL) 12.5 MG tablet Take 1 tablet (12.5 mg total) by mouth daily.   oxyCODONE-acetaminophen (PERCOCET) 7.5-325 MG tablet Take 1 tablet by mouth every 6 (six) hours as needed for severe pain.   quinapril (ACCUPRIL) 20 MG tablet Take 1 tablet (20 mg total) by mouth daily.   No facility-administered encounter medications on file as of 08/30/2021.    Surgical History:  Past Surgical History:  Procedure Laterality Date   FOOT MASS EXCISION Right 2007   PLANTAR FASCIA RELEASE Left 06/09/2016   Procedure: Radical excision plantar fibromas of left plantar arch and left hallux;  Surgeon: Sharlotte Alamo, DPM;  Location: [facility] ORS;  Service: Podiatry;  Laterality: Left;    Medical History: Past Medical History:  Diagnosis Date   Anxiety    Depression    Diabetes mellitus without complication (HCC)    GERD (gastroesophageal reflux disease)    Headache    history of migraines   Hypertension      Family History: Family History  Problem Relation Age of Onset   Diabetes Father    Hypertension Father     Social History   Socioeconomic History   Marital status: Married    Spouse name: Not on file   Number of children: Not on file   Years of education: Not on file   Highest education level: Not on file  Occupational History   Not on file  Tobacco Use   Smoking status: Some Days    Types: Cigarettes   Smokeless tobacco: Never   Tobacco comments:    social smoker   Substance and Sexual Activity   Alcohol use: Yes    Alcohol/week: 3.0 standard drinks    Types: 3 Cans of beer per week   Drug use: No   Sexual activity: Never  Other Topics Concern   Not on file  Social History Narrative   Not on file   Social Determinants of Health   Financial Resource Strain: Not on file  Food Insecurity: Not on file  Transportation Needs: Not on file  Physical Activity: Not on file  Stress: Not on file  Social Connections: Not on file  Intimate Partner Violence: Not on file      Review of Systems  Constitutional:  Negative for chills, fatigue and unexpected weight change.  HENT:  Negative for congestion, rhinorrhea, sneezing and sore throat.   Eyes:  Negative for redness.  Respiratory:  Negative for cough, chest tightness and shortness of breath.   Cardiovascular:  Negative for chest pain and palpitations.  Gastrointestinal:  Negative for abdominal pain, constipation, diarrhea, nausea and vomiting.  Genitourinary:  Negative for dysuria and frequency.  Musculoskeletal:  Positive for back pain (chronic). Negative for arthralgias, joint swelling and neck pain.  Skin:  Negative for rash.  Neurological: Negative.  Negative for tremors and numbness.  Hematological:  Negative for adenopathy. Does not bruise/bleed easily.  Psychiatric/Behavioral:  Negative for behavioral problems (Depression), sleep disturbance and suicidal ideas. The patient is not nervous/anxious.    Vital  Signs: BP (!) 146/110    Pulse 90    Temp 98.6 F (37 C)    Resp 16    Ht 6\' 3"  (1.905 m)    Wt 238 lb (108 kg)    SpO2 98%    BMI 29.75 kg/m    Physical Exam Vitals reviewed.  Constitutional:      General: He is not in acute distress.    Appearance: Normal appearance. He is obese. He is not ill-appearing.  HENT:     Head: Normocephalic and atraumatic.  Eyes:     Pupils: Pupils are equal, round, and reactive to light.  Cardiovascular:     Rate and Rhythm: Normal rate and regular rhythm.  Pulmonary:     Effort: Pulmonary effort is normal. No respiratory distress.  Neurological:     Mental Status: He is alert and  oriented to person, place, and time.  Psychiatric:        Mood and Affect: Mood normal.        Behavior: Behavior normal.       Assessment/Plan: 1. Uncontrolled type 2 diabetes mellitus with hyperglycemia (HCC) A1C is significantly elevated at 12.0, all medications refilled. Follow up in 3 months for repeat A1C.  - POCT HgB A1C - dapagliflozin propanediol (FARXIGA) 10 MG TABS tablet; Take 1 tablet (10 mg total) by mouth daily before breakfast.  Dispense: 90 tablet; Refill: 1 - Dulaglutide (TRULICITY) 3 JE/5.6DJ SOPN; INJECT 3 MG AS DIRECTED ONCE A WEEK  Dispense: 6 mL; Refill: 1 - glimepiride (AMARYL) 4 MG tablet; Take one tab by mouth daily  Dispense: 90 tablet; Refill: 1  2. Essential hypertension Stable with current medications, continue amlodipine, HCTZ and quinapril.  - hydrochlorothiazide (HYDRODIURIL) 12.5 MG tablet; Take 1 tablet (12.5 mg total) by mouth daily.  Dispense: 90 tablet; Refill: 1 - amLODipine (NORVASC) 10 MG tablet; Take 1 tablet (10 mg total) by mouth daily.  Dispense: 90 tablet; Refill: 1  3. Chronic back pain greater than 3 months duration Oxycodone and flexeril refilled to alleviate back pain as needed - cyclobenzaprine (FLEXERIL) 10 MG tablet; Take one tab po qd as needed for back spasm  Dispense: 45 tablet; Refill: 1 -  oxyCODONE-acetaminophen (PERCOCET) 7.5-325 MG tablet; Take 1 tablet by mouth every 6 (six) hours as needed for severe pain.  Dispense: 30 tablet; Refill: 0   General Counseling: Renata Caprice understanding of the findings of todays visit and agrees with plan of treatment. I have discussed any further diagnostic evaluation that may be needed or ordered today. We also reviewed his medications today. he has been encouraged to call the office with any questions or concerns that should arise related to todays visit.    Orders Placed This Encounter  Procedures   POCT HgB A1C    Meds ordered this encounter  Medications   hydrochlorothiazide (HYDRODIURIL) 12.5 MG tablet    Sig: Take 1 tablet (12.5 mg total) by mouth daily.    Dispense:  90 tablet    Refill:  1   dapagliflozin propanediol (FARXIGA) 10 MG TABS tablet    Sig: Take 1 tablet (10 mg total) by mouth daily before breakfast.    Dispense:  90 tablet    Refill:  1   amLODipine (NORVASC) 10 MG tablet    Sig: Take 1 tablet (10 mg total) by mouth daily.    Dispense:  90 tablet    Refill:  1    D/C AMLODIPINE 5MG    cyclobenzaprine (FLEXERIL) 10 MG tablet    Sig: Take one tab po qd as needed for back spasm    Dispense:  45 tablet    Refill:  1   Dulaglutide (TRULICITY) 3 SH/7.55YO SOPN    Sig: INJECT 3 MG AS DIRECTED ONCE A WEEK    Dispense:  6 mL    Refill:  1   glimepiride (AMARYL) 4 MG tablet    Sig: Take one tab by mouth daily    Dispense:  90 tablet    Refill:  1   oxyCODONE-acetaminophen (PERCOCET) 7.5-325 MG tablet    Sig: Take 1 tablet by mouth every 6 (six) hours as needed for severe pain.    Dispense:  30 tablet    Refill:  0   quinapril (ACCUPRIL) 20 MG tablet    Sig: Take 1 tablet (20 mg total) by mouth  daily.    Dispense:  90 tablet    Refill:  0    Return in about 3 months (around 11/27/2021) for F/U, Recheck A1C, Jahdai Padovano PCP.   Total time spent:30 Minutes Time spent includes review of chart, medications,  test results, and follow up plan with the patient.   [place] Controlled Substance Database was reviewed by me.  This patient was seen by Jonetta Osgood, FNP-C in collaboration with Dr. Clayborn Bigness as a part of collaborative care agreement.   Ytzel Gubler R. Valetta Fuller, MSN, FNP-C Internal medicine

## 2021-09-08 ENCOUNTER — Other Ambulatory Visit: Payer: Self-pay

## 2021-09-08 DIAGNOSIS — I10 Essential (primary) hypertension: Secondary | ICD-10-CM

## 2021-09-08 MED ORDER — QUINAPRIL HCL 20 MG PO TABS: 20.0000 mg | ORAL_TABLET | Freq: Every day | ORAL | 1 refills | Status: DC

## 2021-09-18 ENCOUNTER — Encounter: Payer: Self-pay | Admitting: Nurse Practitioner

## 2021-11-30 ENCOUNTER — Ambulatory Visit (INDEPENDENT_AMBULATORY_CARE_PROVIDER_SITE_OTHER): Payer: BC Managed Care – PPO | Admitting: Nurse Practitioner

## 2021-11-30 ENCOUNTER — Encounter: Payer: Self-pay | Admitting: Nurse Practitioner

## 2021-11-30 VITALS — BP 120/85 | HR 98 | Temp 98.5°F | Resp 16 | Ht 75.0 in | Wt 231.2 lb

## 2021-11-30 DIAGNOSIS — I120 Hypertensive chronic kidney disease with stage 5 chronic kidney disease or end stage renal disease: Secondary | ICD-10-CM | POA: Diagnosis not present

## 2021-11-30 DIAGNOSIS — E663 Overweight: Secondary | ICD-10-CM

## 2021-11-30 DIAGNOSIS — I10 Essential (primary) hypertension: Secondary | ICD-10-CM | POA: Diagnosis not present

## 2021-11-30 DIAGNOSIS — M549 Dorsalgia, unspecified: Secondary | ICD-10-CM

## 2021-11-30 DIAGNOSIS — K219 Gastro-esophageal reflux disease without esophagitis: Secondary | ICD-10-CM

## 2021-11-30 DIAGNOSIS — G8929 Other chronic pain: Secondary | ICD-10-CM

## 2021-11-30 DIAGNOSIS — Z6828 Body mass index (BMI) 28.0-28.9, adult: Secondary | ICD-10-CM

## 2021-11-30 DIAGNOSIS — E782 Mixed hyperlipidemia: Secondary | ICD-10-CM | POA: Diagnosis not present

## 2021-11-30 DIAGNOSIS — E1165 Type 2 diabetes mellitus with hyperglycemia: Secondary | ICD-10-CM

## 2021-11-30 LAB — POCT GLYCOSYLATED HEMOGLOBIN (HGB A1C): Hemoglobin A1C: 9.8 % — AB (ref 4.0–5.6)

## 2021-11-30 MED ORDER — AMLODIPINE BESYLATE 10 MG PO TABS: 10.0000 mg | ORAL_TABLET | Freq: Every day | ORAL | 3 refills | Status: DC

## 2021-11-30 MED ORDER — TRULICITY 3 MG/0.5ML ~~LOC~~ SOAJ: SUBCUTANEOUS | 1 refills | Status: DC

## 2021-11-30 MED ORDER — GLIMEPIRIDE 4 MG PO TABS: ORAL_TABLET | ORAL | 1 refills | Status: DC

## 2021-11-30 MED ORDER — ROSUVASTATIN CALCIUM 5 MG PO TABS: ORAL_TABLET | ORAL | 1 refills | Status: DC

## 2021-11-30 MED ORDER — HYDROCHLOROTHIAZIDE 12.5 MG PO TABS: 12.5000 mg | ORAL_TABLET | Freq: Every day | ORAL | 1 refills | Status: DC

## 2021-11-30 MED ORDER — ESOMEPRAZOLE MAGNESIUM 40 MG PO CPDR: 40.0000 mg | DELAYED_RELEASE_CAPSULE | Freq: Every day | ORAL | 1 refills | Status: DC

## 2021-11-30 MED ORDER — DAPAGLIFLOZIN PROPANEDIOL 10 MG PO TABS: 10.0000 mg | ORAL_TABLET | Freq: Every day | ORAL | 1 refills | Status: DC

## 2021-11-30 NOTE — Progress Notes (Cosign Needed)
[facility] [place], [place] [address]  Internal MEDICINE  Office Visit Note  Patient Name: Clinton Moore  [id number]  [id number]  Date of Service: 11/30/2021  Chief Complaint  Patient presents with   Follow-up   Anxiety   Depression   Diabetes   Gastroesophageal Reflux   Hypertension    HPI [name] presents for follow-up visit for diabetes, hypertension, anxiety and depression.  At his previous office visit in January his A1c was 12.0 this has been due to the fact that he ran out of his medications.  His A1c was checked today and was significantly improved at 9.8.  Patient is doing well on current medications and appropriate diet modifications as previously discussed -His blood pressure is stable and well-controlled with current medications -He has lost 7 pounds since previous office visit in January -He needs additional medication refills today   Current Medication: Outpatient Encounter Medications as of 11/30/2021  Medication Sig   cyclobenzaprine (FLEXERIL) 10 MG tablet Take one tab po qd as needed for back spasm   fluticasone (FLONASE) 50 MCG/ACT nasal spray Place 1-2 sprays into both nostrils daily as needed. For allergies.   glucose blood (ACCU-CHEK GUIDE) test strip Use as instructed   Lancets (ACCU-CHEK MULTICLIX) lancets Use as instructed   Melatonin-Pyridoxine 5-1 MG TABS Take 1 tablet by mouth at bedtime as needed. For sleep.   meloxicam (MOBIC) 15 MG tablet Take 1 tablet (15 mg total) by mouth daily.   mirtazapine (REMERON) 7.5 MG tablet TAKE 1 TABLET AT BEDTIME   Multiple Vitamin (MULTIVITAMIN WITH MINERALS) TABS tablet Take 1 tablet by mouth daily. men multivitamin   oxyCODONE-acetaminophen (PERCOCET) 7.5-325 MG tablet Take 1 tablet by mouth every 6 (six) hours as needed for severe pain.   traMADol (ULTRAM) 50 MG tablet Take 1 tablet po BID prn pain   [DISCONTINUED] amLODipine (NORVASC) 10 MG tablet Take 1 tablet (10 mg total) by  mouth daily.   [DISCONTINUED] dapagliflozin propanediol (FARXIGA) 10 MG TABS tablet Take 1 tablet (10 mg total) by mouth daily before breakfast.   [DISCONTINUED] Dulaglutide (TRULICITY) 3 HA/1.9FX SOPN INJECT 3 MG AS DIRECTED ONCE A WEEK   [DISCONTINUED] esomeprazole (NEXIUM) 40 MG capsule Take 1 capsule (40 mg total) by mouth daily.   [DISCONTINUED] glimepiride (AMARYL) 4 MG tablet Take one tab by mouth daily   [DISCONTINUED] hydrochlorothiazide (HYDRODIURIL) 12.5 MG tablet Take 1 tablet (12.5 mg total) by mouth daily.   [DISCONTINUED] montelukast (SINGULAIR) 10 MG tablet Take 1 tablet (10 mg total) by mouth at bedtime.   [DISCONTINUED] quinapril (ACCUPRIL) 20 MG tablet Take 1 tablet (20 mg total) by mouth daily.   [DISCONTINUED] rosuvastatin (CRESTOR) 5 MG tablet Take one tab 2 x a week for high chol   amLODipine (NORVASC) 10 MG tablet Take 1 tablet (10 mg total) by mouth daily.   dapagliflozin propanediol (FARXIGA) 10 MG TABS tablet Take 1 tablet (10 mg total) by mouth daily before breakfast.   Dulaglutide (TRULICITY) 3 TK/2.4OX SOPN INJECT 3 MG AS DIRECTED ONCE A WEEK   esomeprazole (NEXIUM) 40 MG capsule Take 1 capsule (40 mg total) by mouth daily.   glimepiride (AMARYL) 4 MG tablet Take one tab by mouth daily   hydrochlorothiazide (HYDRODIURIL) 12.5 MG tablet Take 1 tablet (12.5 mg total) by mouth daily.   rosuvastatin (CRESTOR) 5 MG tablet Take one tab 2 x a week for high chol   No facility-administered encounter medications on file as of 11/30/2021.  Surgical History: Past Surgical History:  Procedure Laterality Date   FOOT MASS EXCISION Right 2007   PLANTAR FASCIA RELEASE Left 06/09/2016   Procedure: Radical excision plantar fibromas of left plantar arch and left hallux;  Surgeon: Sharlotte Alamo, DPM;  Location: [facility] ORS;  Service: Podiatry;  Laterality: Left;    Medical History: Past Medical History:  Diagnosis Date   Anxiety    Depression    Diabetes mellitus without  complication (HCC)    GERD (gastroesophageal reflux disease)    Headache    history of migraines   Hypertension     Family History: Family History  Problem Relation Age of Onset   Diabetes Father    Hypertension Father     Social History   Socioeconomic History   Marital status: Married    Spouse name: Not on file   Number of children: Not on file   Years of education: Not on file   Highest education level: Not on file  Occupational History   Not on file  Tobacco Use   Smoking status: Some Days    Types: Cigarettes   Smokeless tobacco: Never   Tobacco comments:    social smoker   Substance and Sexual Activity   Alcohol use: Yes    Alcohol/week: 3.0 standard drinks of alcohol    Types: 3 Cans of beer per week   Drug use: No   Sexual activity: Never  Other Topics Concern   Not on file  Social History Narrative   Not on file   Social Determinants of Health   Financial Resource Strain: Not on file  Food Insecurity: Not on file  Transportation Needs: Not on file  Physical Activity: Not on file  Stress: Not on file  Social Connections: Not on file  Intimate Partner Violence: Not on file      Review of Systems  Constitutional:  Negative for chills, fatigue and unexpected weight change.  HENT:  Negative for congestion, rhinorrhea, sneezing and sore throat.   Eyes:  Negative for redness.  Respiratory:  Negative for cough, chest tightness and shortness of breath.   Cardiovascular:  Negative for chest pain and palpitations.  Gastrointestinal:  Negative for abdominal pain, constipation, diarrhea, nausea and vomiting.  Genitourinary:  Negative for dysuria and frequency.  Musculoskeletal:  Positive for back pain (chronic). Negative for arthralgias, joint swelling and neck pain.  Skin:  Negative for rash.  Neurological: Negative.  Negative for tremors and numbness.  Hematological:  Negative for adenopathy. Does not bruise/bleed easily.  Psychiatric/Behavioral:   Negative for behavioral problems (Depression), sleep disturbance and suicidal ideas. The patient is not nervous/anxious.     Vital Signs: BP 120/85   Pulse 98   Temp 98.5 F (36.9 C)   Resp 16   Ht '6\' 3"'$  (1.905 m)   Wt 231 lb 3.2 oz (104.9 kg)   SpO2 98%   BMI 28.90 kg/m    Physical Exam Vitals reviewed.  Constitutional:      General: He is not in acute distress.    Appearance: Normal appearance. He is obese. He is not ill-appearing.  HENT:     Head: Normocephalic and atraumatic.  Eyes:     Pupils: Pupils are equal, round, and reactive to light.  Cardiovascular:     Rate and Rhythm: Normal rate and regular rhythm.  Pulmonary:     Effort: Pulmonary effort is normal. No respiratory distress.  Neurological:     Mental Status: He is alert and oriented  to person, place, and time.  Psychiatric:        Mood and Affect: Mood normal.        Behavior: Behavior normal.        Assessment/Plan: 1. Uncontrolled type 2 diabetes mellitus with hyperglycemia (HCC) A1c is still elevated at 9.8 but significantly improved from previous A1c of 12 in January earlier this year.  Refills of medications ordered today, follow-up in 3 months for repeat A1c - POCT HgB A1C - Dulaglutide (TRULICITY) 3 GE/3.6OQ SOPN; INJECT 3 MG AS DIRECTED ONCE A WEEK  Dispense: 6 mL; Refill: 1 - glimepiride (AMARYL) 4 MG tablet; Take one tab by mouth daily  Dispense: 90 tablet; Refill: 1 - dapagliflozin propanediol (FARXIGA) 10 MG TABS tablet; Take 1 tablet (10 mg total) by mouth daily before breakfast.  Dispense: 90 tablet; Refill: 1  2. Essential hypertension Blood pressure well controlled with current medications, refills ordered today. - hydrochlorothiazide (HYDRODIURIL) 12.5 MG tablet; Take 1 tablet (12.5 mg total) by mouth daily.  Dispense: 90 tablet; Refill: 1 - amLODipine (NORVASC) 10 MG tablet; Take 1 tablet (10 mg total) by mouth daily.  Dispense: 90 tablet; Refill: 3  3. Mixed  hyperlipidemia Continue rosuvastatin twice a week for high cholesterol, refills ordered. - rosuvastatin (CRESTOR) 5 MG tablet; Take one tab 2 x a week for high chol  Dispense: 45 tablet; Refill: 1  4. Chronic back pain greater than 3 months duration Patient has chronic back pain and takes medication as needed, no refills needed today.  5. Gastroesophageal reflux disease without esophagitis May continue esomeprazole for her acid reflux control, refills ordered. - esomeprazole (NEXIUM) 40 MG capsule; Take 1 capsule (40 mg total) by mouth daily.  Dispense: 90 capsule; Refill: 1  6. Overweight with body mass index (BMI) of 28 to 28.9 in adult Patient has lost 7 pounds since previous office visit in January, should continue to see weight loss as improvement in A1c continues, continue diet modifications to control glucose levels which will also aid in weight loss as previously discussed.  Physical activity as tolerated, will reassess in 3 months   General Counseling: Renata Caprice understanding of the findings of todays visit and agrees with plan of treatment. I have discussed any further diagnostic evaluation that may be needed or ordered today. We also reviewed his medications today. he has been encouraged to call the office with any questions or concerns that should arise related to todays visit.    Orders Placed This Encounter  Procedures   POCT HgB A1C    Meds ordered this encounter  Medications   hydrochlorothiazide (HYDRODIURIL) 12.5 MG tablet    Sig: Take 1 tablet (12.5 mg total) by mouth daily.    Dispense:  90 tablet    Refill:  1   amLODipine (NORVASC) 10 MG tablet    Sig: Take 1 tablet (10 mg total) by mouth daily.    Dispense:  90 tablet    Refill:  3   Dulaglutide (TRULICITY) 3 HU/7.6LY SOPN    Sig: INJECT 3 MG AS DIRECTED ONCE A WEEK    Dispense:  6 mL    Refill:  1   glimepiride (AMARYL) 4 MG tablet    Sig: Take one tab by mouth daily    Dispense:  90 tablet     Refill:  1   rosuvastatin (CRESTOR) 5 MG tablet    Sig: Take one tab 2 x a week for high chol    Dispense:  45 tablet    Refill:  1   dapagliflozin propanediol (FARXIGA) 10 MG TABS tablet    Sig: Take 1 tablet (10 mg total) by mouth daily before breakfast.    Dispense:  90 tablet    Refill:  1   esomeprazole (NEXIUM) 40 MG capsule    Sig: Take 1 capsule (40 mg total) by mouth daily.    Dispense:  90 capsule    Refill:  1    Return in about 3 months (around 03/02/2022) for F/U, Recheck A1C, Dimitri Dsouza PCP.   Total time spent:30 Minutes Time spent includes review of chart, medications, test results, and follow up plan with the patient.   [place] Controlled Substance Database was reviewed by me.  This patient was seen by Jonetta Osgood, FNP-C in collaboration with Dr. Clayborn Bigness as a part of collaborative care agreement.   Kamauri Kathol R. Valetta Fuller, MSN, FNP-C Internal medicine

## 2021-12-07 ENCOUNTER — Other Ambulatory Visit: Payer: Self-pay

## 2021-12-07 DIAGNOSIS — T7840XD Allergy, unspecified, subsequent encounter: Secondary | ICD-10-CM

## 2021-12-07 MED ORDER — MONTELUKAST SODIUM 10 MG PO TABS: 10.0000 mg | ORAL_TABLET | Freq: Every day | ORAL | 1 refills | Status: DC

## 2022-01-15 ENCOUNTER — Encounter: Payer: Self-pay | Admitting: Nurse Practitioner

## 2022-03-02 ENCOUNTER — Ambulatory Visit (INDEPENDENT_AMBULATORY_CARE_PROVIDER_SITE_OTHER): Payer: BC Managed Care – PPO | Admitting: Nurse Practitioner

## 2022-03-02 ENCOUNTER — Encounter: Payer: Self-pay | Admitting: Nurse Practitioner

## 2022-03-02 VITALS — BP 136/88 | HR 98 | Temp 98.2°F | Resp 16 | Ht 75.0 in | Wt 228.0 lb

## 2022-03-02 DIAGNOSIS — I10 Essential (primary) hypertension: Secondary | ICD-10-CM | POA: Diagnosis not present

## 2022-03-02 DIAGNOSIS — J302 Other seasonal allergic rhinitis: Secondary | ICD-10-CM

## 2022-03-02 DIAGNOSIS — E1165 Type 2 diabetes mellitus with hyperglycemia: Secondary | ICD-10-CM | POA: Diagnosis not present

## 2022-03-02 LAB — POCT GLYCOSYLATED HEMOGLOBIN (HGB A1C): Hemoglobin A1C: 12.6 % — AB (ref 4.0–5.6)

## 2022-03-02 MED ORDER — FLUTICASONE PROPIONATE 50 MCG/ACT NA SUSP: 1.0000 | Freq: Every day | NASAL | 3 refills | Status: DC | PRN

## 2022-03-02 MED ORDER — TIRZEPATIDE 5 MG/0.5ML ~~LOC~~ SOAJ: 5.0000 mg | SUBCUTANEOUS | 2 refills | Status: DC

## 2022-03-02 NOTE — Progress Notes (Signed)
[facility] [place], [place] [address]  Internal MEDICINE  Office Visit Note  Patient Name: Clinton Moore  [id number]  [id number]  Date of Service: 03/02/2022  Chief Complaint  Patient presents with   Follow-up    Discuss meds    Medication Refill    HPI Demarea presents for a follow up visit for diabetes, hypertension, refills.  Diabetes -- A1c increased significantly to 12.6, an increase of 2.8 since may. He reports that he has not been consistently checking his sugars and has been drinking 1-2 liters of apple juice daily. He has also not been paying attention to the foods he is eating.  Hypertension -- controlled on current medications Allergic rhinitis -- using nasal spray      Current Medication: Outpatient Encounter Medications as of 03/02/2022  Medication Sig   amLODipine (NORVASC) 10 MG tablet Take 1 tablet (10 mg total) by mouth daily.   cyclobenzaprine (FLEXERIL) 10 MG tablet Take one tab po qd as needed for back spasm   dapagliflozin propanediol (FARXIGA) 10 MG TABS tablet Take 1 tablet (10 mg total) by mouth daily before breakfast.   esomeprazole (NEXIUM) 40 MG capsule Take 1 capsule (40 mg total) by mouth daily.   fluticasone (FLONASE) 50 MCG/ACT nasal spray Place 1-2 sprays into both nostrils daily as needed. For allergies.   glimepiride (AMARYL) 4 MG tablet Take one tab by mouth daily   glucose blood (ACCU-CHEK GUIDE) test strip Use as instructed   hydrochlorothiazide (HYDRODIURIL) 12.5 MG tablet Take 1 tablet (12.5 mg total) by mouth daily.   Lancets (ACCU-CHEK MULTICLIX) lancets Use as instructed   Melatonin-Pyridoxine 5-1 MG TABS Take 1 tablet by mouth at bedtime as needed. For sleep.   meloxicam (MOBIC) 15 MG tablet Take 1 tablet (15 mg total) by mouth daily.   mirtazapine (REMERON) 7.5 MG tablet TAKE 1 TABLET AT BEDTIME   montelukast (SINGULAIR) 10 MG tablet Take 1 tablet (10 mg total) by mouth at bedtime.   Multiple Vitamin  (MULTIVITAMIN WITH MINERALS) TABS tablet Take 1 tablet by mouth daily. men multivitamin   oxyCODONE-acetaminophen (PERCOCET) 7.5-325 MG tablet Take 1 tablet by mouth every 6 (six) hours as needed for severe pain.   rosuvastatin (CRESTOR) 5 MG tablet Take one tab 2 x a week for high chol   tirzepatide (MOUNJARO) 5 MG/0.5ML Pen Inject 5 mg into the skin once a week.   traMADol (ULTRAM) 50 MG tablet Take 1 tablet po BID prn pain   [DISCONTINUED] Dulaglutide (TRULICITY) 3 EH/6.3JS SOPN INJECT 3 MG AS DIRECTED ONCE A WEEK   No facility-administered encounter medications on file as of 03/02/2022.    Surgical History: Past Surgical History:  Procedure Laterality Date   FOOT MASS EXCISION Right 2007   PLANTAR FASCIA RELEASE Left 06/09/2016   Procedure: Radical excision plantar fibromas of left plantar arch and left hallux;  Surgeon: Sharlotte Alamo, DPM;  Location: [facility] ORS;  Service: Podiatry;  Laterality: Left;    Medical History: Past Medical History:  Diagnosis Date   Anxiety    Depression    Diabetes mellitus without complication (HCC)    GERD (gastroesophageal reflux disease)    Headache    history of migraines   Hypertension     Family History: Family History  Problem Relation Age of Onset   Diabetes Father    Hypertension Father     Social History   Socioeconomic History   Marital status: Married    Spouse name:  Not on file   Number of children: Not on file   Years of education: Not on file   Highest education level: Not on file  Occupational History   Not on file  Tobacco Use   Smoking status: Some Days    Types: Cigarettes   Smokeless tobacco: Never   Tobacco comments:    social smoker   Substance and Sexual Activity   Alcohol use: Yes    Alcohol/week: 3.0 standard drinks of alcohol    Types: 3 Cans of beer per week   Drug use: No   Sexual activity: Never  Other Topics Concern   Not on file  Social History Narrative   Not on file   Social Determinants of  Health   Financial Resource Strain: Not on file  Food Insecurity: Not on file  Transportation Needs: Not on file  Physical Activity: Not on file  Stress: Not on file  Social Connections: Not on file  Intimate Partner Violence: Not on file      Review of Systems  Constitutional:  Positive for fatigue. Negative for chills and unexpected weight change.  HENT:  Negative for congestion, rhinorrhea, sneezing and sore throat.   Eyes:  Positive for visual disturbance. Negative for redness.  Respiratory:  Negative for cough, chest tightness and shortness of breath.   Cardiovascular: Negative.  Negative for chest pain and palpitations.  Gastrointestinal:  Negative for abdominal pain, constipation, diarrhea, nausea and vomiting.  Endocrine: Positive for polydipsia, polyphagia and polyuria.  Genitourinary:  Negative for dysuria and frequency.  Musculoskeletal:  Positive for back pain (chronic). Negative for arthralgias, joint swelling and neck pain.  Neurological:  Positive for headaches. Negative for tremors and numbness.  Hematological:  Negative for adenopathy. Does not bruise/bleed easily.  Psychiatric/Behavioral:  Negative for behavioral problems (Depression), sleep disturbance and suicidal ideas. The patient is not nervous/anxious.     Vital Signs: BP (!) 136/90   Pulse (!) 105   Temp 98.2 F (36.8 C)   Resp 16   Ht '6\' 3"'$  (1.905 m)   Wt 228 lb (103.4 kg)   SpO2 97%   BMI 28.50 kg/m    Physical Exam Vitals reviewed.  Constitutional:      General: He is not in acute distress.    Appearance: Normal appearance. He is obese. He is not ill-appearing.  HENT:     Head: Normocephalic and atraumatic.  Eyes:     Pupils: Pupils are equal, round, and reactive to light.  Cardiovascular:     Rate and Rhythm: Normal rate and regular rhythm.  Pulmonary:     Effort: Pulmonary effort is normal. No respiratory distress.  Neurological:     Mental Status: He is alert and oriented to  person, place, and time.  Psychiatric:        Mood and Affect: Mood normal.        Behavior: Behavior normal.        Assessment/Plan: 1. Uncontrolled type 2 diabetes mellitus with hyperglycemia (HCC) Significantly elevated a1c, sample of 2.5 mg mounjaro provided to patient and the 5 mg dose was prescribed. Trulicity discontinued. Discussed diet and lifestyle modifications, encouraged patient to stop drinking juice and to drink more water instead. Check glucose at least once daily, Preferably fasting. Follow up in 1 month, and bring glucose log - Microalbumin, urine - POCT HgB A1C - tirzepatide (MOUNJARO) 5 MG/0.5ML Pen; Inject 5 mg into the skin once a week.  Dispense: 6 mL; Refill: 2  2.  Essential hypertension Stable, continue medications as prescribed.   3. Seasonal allergies Refills ordered, symptoms controlled - fluticasone (FLONASE) 50 MCG/ACT nasal spray; Place 1-2 sprays into both nostrils daily as needed. For allergies.  Dispense: 48 g; Refill: 3   General Counseling: Renata Caprice understanding of the findings of todays visit and agrees with plan of treatment. I have discussed any further diagnostic evaluation that may be needed or ordered today. We also reviewed his medications today. he has been encouraged to call the office with any questions or concerns that should arise related to todays visit.    Orders Placed This Encounter  Procedures   Microalbumin, urine   POCT HgB A1C    Meds ordered this encounter  Medications   tirzepatide (MOUNJARO) 5 MG/0.5ML Pen    Sig: Inject 5 mg into the skin once a week.    Dispense:  6 mL    Refill:  2    Discontinue trulicity, please fill new prescription for mounjaro asap.    Return in about 1 month (around 04/02/2022) for F/U, eval new med, [place] PCP.   Total time spent:30 Minutes Time spent includes review of chart, medications, test results, and follow up plan with the patient.   [place] Controlled Substance Database  was reviewed by me.  This patient was seen by Jonetta Osgood, FNP-C in collaboration with Dr. Clayborn Bigness as a part of collaborative care agreement.   Donyetta Ogletree R. Valetta Fuller, MSN, FNP-C Internal medicine

## 2022-03-03 LAB — MICROALBUMIN, URINE: Microalbumin, Urine: 3.2 ug/mL

## 2022-04-16 ENCOUNTER — Encounter: Payer: Self-pay | Admitting: Nurse Practitioner

## 2022-05-10 ENCOUNTER — Encounter: Payer: BC Managed Care – PPO | Admitting: Nurse Practitioner

## 2022-05-19 ENCOUNTER — Encounter: Payer: BC Managed Care – PPO | Admitting: Nurse Practitioner

## 2022-05-22 ENCOUNTER — Encounter: Payer: Self-pay | Admitting: Nurse Practitioner

## 2022-05-22 ENCOUNTER — Ambulatory Visit (INDEPENDENT_AMBULATORY_CARE_PROVIDER_SITE_OTHER): Payer: BC Managed Care – PPO | Admitting: Nurse Practitioner

## 2022-05-22 VITALS — BP 140/82 | Temp 98.3°F | Resp 16 | Ht 75.0 in | Wt 231.4 lb

## 2022-05-22 DIAGNOSIS — Z0001 Encounter for general adult medical examination with abnormal findings: Secondary | ICD-10-CM

## 2022-05-22 DIAGNOSIS — E559 Vitamin D deficiency, unspecified: Secondary | ICD-10-CM

## 2022-05-22 DIAGNOSIS — R300 Dysuria: Secondary | ICD-10-CM | POA: Diagnosis not present

## 2022-05-22 DIAGNOSIS — E1165 Type 2 diabetes mellitus with hyperglycemia: Secondary | ICD-10-CM

## 2022-05-22 DIAGNOSIS — Z125 Encounter for screening for malignant neoplasm of prostate: Secondary | ICD-10-CM

## 2022-05-22 DIAGNOSIS — I10 Essential (primary) hypertension: Secondary | ICD-10-CM

## 2022-05-22 DIAGNOSIS — Z760 Encounter for issue of repeat prescription: Secondary | ICD-10-CM

## 2022-05-22 DIAGNOSIS — E782 Mixed hyperlipidemia: Secondary | ICD-10-CM

## 2022-05-22 DIAGNOSIS — T7840XD Allergy, unspecified, subsequent encounter: Secondary | ICD-10-CM

## 2022-05-22 DIAGNOSIS — K219 Gastro-esophageal reflux disease without esophagitis: Secondary | ICD-10-CM

## 2022-05-22 DIAGNOSIS — G8929 Other chronic pain: Secondary | ICD-10-CM

## 2022-05-22 DIAGNOSIS — M722 Plantar fascial fibromatosis: Secondary | ICD-10-CM

## 2022-05-22 MED ORDER — DAPAGLIFLOZIN PROPANEDIOL 10 MG PO TABS: 10.0000 mg | ORAL_TABLET | Freq: Every day | ORAL | 1 refills | Status: DC

## 2022-05-22 MED ORDER — ROSUVASTATIN CALCIUM 5 MG PO TABS: ORAL_TABLET | ORAL | 1 refills | Status: DC

## 2022-05-22 MED ORDER — TIRZEPATIDE 7.5 MG/0.5ML ~~LOC~~ SOAJ: 7.5000 mg | SUBCUTANEOUS | 2 refills | Status: DC

## 2022-05-22 MED ORDER — TRAMADOL HCL 50 MG PO TABS: ORAL_TABLET | ORAL | 1 refills | Status: DC

## 2022-05-22 MED ORDER — ESOMEPRAZOLE MAGNESIUM 40 MG PO CPDR: 40.0000 mg | DELAYED_RELEASE_CAPSULE | Freq: Every day | ORAL | 1 refills | Status: DC

## 2022-05-22 MED ORDER — MONTELUKAST SODIUM 10 MG PO TABS: 10.0000 mg | ORAL_TABLET | Freq: Every day | ORAL | 1 refills | Status: DC

## 2022-05-22 MED ORDER — CYCLOBENZAPRINE HCL 10 MG PO TABS: ORAL_TABLET | ORAL | 1 refills | Status: DC

## 2022-05-22 MED ORDER — HYDROCHLOROTHIAZIDE 12.5 MG PO TABS: 12.5000 mg | ORAL_TABLET | Freq: Every day | ORAL | 1 refills | Status: DC

## 2022-05-22 MED ORDER — OXYCODONE-ACETAMINOPHEN 7.5-325 MG PO TABS: 1.0000 | ORAL_TABLET | Freq: Four times a day (QID) | ORAL | 0 refills | Status: DC | PRN

## 2022-05-22 MED ORDER — GLIMEPIRIDE 4 MG PO TABS: ORAL_TABLET | ORAL | 1 refills | Status: DC

## 2022-05-22 MED ORDER — MIRTAZAPINE 15 MG PO TABS: 15.0000 mg | ORAL_TABLET | Freq: Every day | ORAL | 1 refills | Status: DC

## 2022-05-22 NOTE — Progress Notes (Signed)
[facility] [place], [place] [address]  Internal MEDICINE  Office Visit Note  Patient Name: Clinton Moore  [id number]  [id number]  Date of Service: 05/22/2022  Chief Complaint  Patient presents with   Diabetes   Gastroesophageal Reflux   Depression   Hypertension   Annual Exam    HPI Clinton Moore presents for an annual well visit and physical exam.  Well-appearing 56 year old male with diabetes, hypertension, GERD, and high cholesterol --at previous office visit, his A1c significantly increased. It is too early to check his A1C today.  PSA level and other labs due today.  BP elevated, better when rechecked  Cologuard test due December 2024 Diabetic eye exam reports that it is scheduled Diabetic foot exam done today       Current Medication: Outpatient Encounter Medications as of 05/22/2022  Medication Sig   amLODipine (NORVASC) 10 MG tablet Take 1 tablet (10 mg total) by mouth daily.   fluticasone (FLONASE) 50 MCG/ACT nasal spray Place 1-2 sprays into both nostrils daily as needed. For allergies.   glucose blood (ACCU-CHEK GUIDE) test strip Use as instructed   Lancets (ACCU-CHEK MULTICLIX) lancets Use as instructed   Melatonin-Pyridoxine 5-1 MG TABS Take 1 tablet by mouth at bedtime as needed. For sleep.   meloxicam (MOBIC) 15 MG tablet Take 1 tablet (15 mg total) by mouth daily.   mirtazapine (REMERON) 15 MG tablet Take 1 tablet (15 mg total) by mouth at bedtime.   Multiple Vitamin (MULTIVITAMIN WITH MINERALS) TABS tablet Take 1 tablet by mouth daily. men multivitamin   tirzepatide (MOUNJARO) 7.5 MG/0.5ML Pen Inject 7.5 mg into the skin once a week.   [DISCONTINUED] cyclobenzaprine (FLEXERIL) 10 MG tablet Take one tab po qd as needed for back spasm   [DISCONTINUED] dapagliflozin propanediol (FARXIGA) 10 MG TABS tablet Take 1 tablet (10 mg total) by mouth daily before breakfast.   [DISCONTINUED] esomeprazole (NEXIUM) 40 MG capsule Take 1  capsule (40 mg total) by mouth daily.   [DISCONTINUED] glimepiride (AMARYL) 4 MG tablet Take one tab by mouth daily   [DISCONTINUED] hydrochlorothiazide (HYDRODIURIL) 12.5 MG tablet Take 1 tablet (12.5 mg total) by mouth daily.   [DISCONTINUED] mirtazapine (REMERON) 7.5 MG tablet TAKE 1 TABLET AT BEDTIME   [DISCONTINUED] montelukast (SINGULAIR) 10 MG tablet Take 1 tablet (10 mg total) by mouth at bedtime.   [DISCONTINUED] oxyCODONE-acetaminophen (PERCOCET) 7.5-325 MG tablet Take 1 tablet by mouth every 6 (six) hours as needed for severe pain.   [DISCONTINUED] rosuvastatin (CRESTOR) 5 MG tablet Take one tab 2 x a week for high chol   [DISCONTINUED] tirzepatide (MOUNJARO) 5 MG/0.5ML Pen Inject 5 mg into the skin once a week.   [DISCONTINUED] traMADol (ULTRAM) 50 MG tablet Take 1 tablet po BID prn pain   cyclobenzaprine (FLEXERIL) 10 MG tablet Take one tab po qd as needed for back spasm   dapagliflozin propanediol (FARXIGA) 10 MG TABS tablet Take 1 tablet (10 mg total) by mouth daily before breakfast.   esomeprazole (NEXIUM) 40 MG capsule Take 1 capsule (40 mg total) by mouth daily.   glimepiride (AMARYL) 4 MG tablet Take one tab by mouth daily   hydrochlorothiazide (HYDRODIURIL) 12.5 MG tablet Take 1 tablet (12.5 mg total) by mouth daily.   montelukast (SINGULAIR) 10 MG tablet Take 1 tablet (10 mg total) by mouth at bedtime.   oxyCODONE-acetaminophen (PERCOCET) 7.5-325 MG tablet Take 1 tablet by mouth every 6 (six) hours as needed for severe pain.  rosuvastatin (CRESTOR) 5 MG tablet Take one tab 2 x a week for high chol   traMADol (ULTRAM) 50 MG tablet Take 1 tablet po BID prn pain   No facility-administered encounter medications on file as of 05/22/2022.    Surgical History: Past Surgical History:  Procedure Laterality Date   FOOT MASS EXCISION Right 2007   PLANTAR FASCIA RELEASE Left 06/09/2016   Procedure: Radical excision plantar fibromas of left plantar arch and left hallux;  Surgeon:  Sharlotte Alamo, DPM;  Location: [facility] ORS;  Service: Podiatry;  Laterality: Left;    Medical History: Past Medical History:  Diagnosis Date   Anxiety    Depression    Diabetes mellitus without complication (HCC)    GERD (gastroesophageal reflux disease)    Headache    history of migraines   Hypertension     Family History: Family History  Problem Relation Age of Onset   Diabetes Father    Hypertension Father     Social History   Socioeconomic History   Marital status: Married    Spouse name: Not on file   Number of children: Not on file   Years of education: Not on file   Highest education level: Not on file  Occupational History   Not on file  Tobacco Use   Smoking status: Some Days    Types: Cigarettes   Smokeless tobacco: Never   Tobacco comments:    social smoker   Substance and Sexual Activity   Alcohol use: Yes    Alcohol/week: 3.0 standard drinks of alcohol    Types: 3 Cans of beer per week   Drug use: No   Sexual activity: Never  Other Topics Concern   Not on file  Social History Narrative   Not on file   Social Determinants of Health   Financial Resource Strain: Not on file  Food Insecurity: Not on file  Transportation Needs: Not on file  Physical Activity: Not on file  Stress: Not on file  Social Connections: Not on file  Intimate Partner Violence: Not on file      Review of Systems  Constitutional:  Negative for activity change, appetite change, chills, fatigue, fever and unexpected weight change.  HENT: Negative.  Negative for congestion, ear pain, rhinorrhea, sore throat and trouble swallowing.   Eyes: Negative.   Respiratory: Negative.  Negative for cough, chest tightness, shortness of breath and wheezing.   Cardiovascular: Negative.  Negative for chest pain and palpitations.  Gastrointestinal: Negative.  Negative for abdominal pain, blood in stool, constipation, diarrhea, nausea and vomiting.  Endocrine: Negative.   Genitourinary:  Negative.  Negative for difficulty urinating, dysuria, frequency, hematuria and urgency.  Musculoskeletal: Negative.  Negative for arthralgias, back pain, joint swelling, myalgias and neck pain.  Skin: Negative.  Negative for rash and wound.  Allergic/Immunologic: Negative.  Negative for immunocompromised state.  Neurological: Negative.  Negative for dizziness, seizures, numbness and headaches.  Hematological: Negative.   Psychiatric/Behavioral: Negative.  Negative for behavioral problems, self-injury and suicidal ideas. The patient is not nervous/anxious.     Vital Signs: BP (!) 140/82   Temp 98.3 F (36.8 C)   Resp 16   Ht 6' 3"  (1.905 m)   Wt 231 lb 6.4 oz (105 kg)   SpO2 96%   BMI 28.92 kg/m    Physical Exam Vitals reviewed.  Constitutional:      General: He is awake. He is not in acute distress.    Appearance: Normal appearance. He is  well-developed and well-groomed. He is obese. He is not ill-appearing or diaphoretic.  HENT:     Head: Normocephalic and atraumatic.     Right Ear: Tympanic membrane, ear canal and external ear normal.     Left Ear: Tympanic membrane, ear canal and external ear normal.     Nose: Nose normal. No congestion or rhinorrhea.     Mouth/Throat:     Lips: Pink.     Mouth: Mucous membranes are moist.     Pharynx: Oropharynx is clear. Uvula midline. No oropharyngeal exudate or posterior oropharyngeal erythema.  Eyes:     General: Lids are normal. Vision grossly intact. Gaze aligned appropriately. No scleral icterus.       Right eye: No discharge.        Left eye: No discharge.     Extraocular Movements: Extraocular movements intact.     Conjunctiva/sclera: Conjunctivae normal.     Pupils: Pupils are equal, round, and reactive to light.     Funduscopic exam:    Right eye: Red reflex present.        Left eye: Red reflex present. Neck:     Thyroid: No thyromegaly.     Vascular: No JVD.     Trachea: Trachea and phonation normal. No tracheal  deviation.  Cardiovascular:     Rate and Rhythm: Normal rate and regular rhythm.     Pulses:          Carotid pulses are 3+ on the right side and 3+ on the left side.      Radial pulses are 2+ on the right side and 2+ on the left side.       Dorsalis pedis pulses are 2+ on the right side and 2+ on the left side.       Posterior tibial pulses are 2+ on the right side and 2+ on the left side.     Heart sounds: Normal heart sounds, S1 normal and S2 normal. No murmur heard.    No friction rub. No gallop.  Pulmonary:     Effort: Pulmonary effort is normal. No accessory muscle usage or respiratory distress.     Breath sounds: Normal breath sounds and air entry. No stridor. No wheezing or rales.  Chest:     Chest wall: No tenderness.  Abdominal:     General: Bowel sounds are normal. There is no distension.     Palpations: Abdomen is soft. There is no shifting dullness, fluid wave, mass or pulsatile mass.     Tenderness: There is no abdominal tenderness. There is no guarding or rebound.  Musculoskeletal:        General: No tenderness or deformity. Normal range of motion.     Cervical back: Normal range of motion and neck supple.     Right foot: Normal range of motion. No deformity, bunion, Charcot foot, foot drop or prominent metatarsal heads.     Left foot: Normal range of motion. No deformity, bunion, Charcot foot, foot drop or prominent metatarsal heads.  Feet:     Right foot:     Protective Sensation: 6 sites tested.  6 sites sensed.     Skin integrity: Dry skin present. No ulcer, blister, skin breakdown, erythema, warmth, callus or fissure.     Toenail Condition: Right toenails are long.     Left foot:     Protective Sensation: 6 sites tested.  6 sites sensed.     Skin integrity: Dry skin present. No ulcer, blister, skin  breakdown, erythema, warmth, callus or fissure.     Toenail Condition: Left toenails are long.  Lymphadenopathy:     Cervical: No cervical adenopathy.  Skin:     General: Skin is warm and dry.     Capillary Refill: Capillary refill takes less than 2 seconds.     Coloration: Skin is not pale.     Findings: No erythema or rash.  Neurological:     Mental Status: He is alert and oriented to person, place, and time.     Cranial Nerves: No cranial nerve deficit.     Motor: No abnormal muscle tone.     Coordination: Coordination normal.     Gait: Gait normal.     Deep Tendon Reflexes: Reflexes are normal and symmetric.  Psychiatric:        Mood and Affect: Mood and affect normal.        Speech: Speech normal.        Behavior: Behavior normal. Behavior is cooperative.        Thought Content: Thought content normal.        Judgment: Judgment normal.        Assessment/Plan: 1. Encounter for general adult medical examination with abnormal findings Age-appropriate preventive screenings and vaccinations discussed, annual physical exam completed. Routine labs for health maintenance ordered, see below. PHM updated.  - CBC with Differential/Platelet - CMP14+EGFR - Lipid Profile - Vitamin D (25 hydroxy) - TSH + free T4 - PSA Total (Reflex To Free)  2. Uncontrolled type 2 diabetes mellitus with hyperglycemia ([place]) Routine labs ordered and urine sent for microalbumin, next visit will repeat a1c - CBC with Differential/Platelet - CMP14+EGFR - TSH + free T4 - Urine Microalbumin w/creat. ratio  3. Mixed hyperlipidemia Routine labs ordered - CMP14+EGFR - Lipid Profile - TSH + free T4  4. Vitamin D deficiency Routine lab ordered - Vitamin D (25 hydroxy)  5. Dysuria Routine urinalysis done - UA/M w/rflx Culture, Routine - Microscopic Examination  6. Screening for prostate cancer Routine labs ordered - PSA Total (Reflex To Free)  7. Medication refill - mirtazapine (REMERON) 15 MG tablet; Take 1 tablet (15 mg total) by mouth at bedtime.  Dispense: 90 tablet; Refill: 1 - tirzepatide (MOUNJARO) 7.5 MG/0.5ML Pen; Inject 7.5 mg into the skin  once a week.  Dispense: 6 mL; Refill: 2 - traMADol (ULTRAM) 50 MG tablet; Take 1 tablet po BID prn pain  Dispense: 180 tablet; Refill: 1 - rosuvastatin (CRESTOR) 5 MG tablet; Take one tab 2 x a week for high chol  Dispense: 45 tablet; Refill: 1 - montelukast (SINGULAIR) 10 MG tablet; Take 1 tablet (10 mg total) by mouth at bedtime.  Dispense: 90 tablet; Refill: 1 - hydrochlorothiazide (HYDRODIURIL) 12.5 MG tablet; Take 1 tablet (12.5 mg total) by mouth daily.  Dispense: 90 tablet; Refill: 1 - glimepiride (AMARYL) 4 MG tablet; Take one tab by mouth daily  Dispense: 90 tablet; Refill: 1 - esomeprazole (NEXIUM) 40 MG capsule; Take 1 capsule (40 mg total) by mouth daily.  Dispense: 90 capsule; Refill: 1 - dapagliflozin propanediol (FARXIGA) 10 MG TABS tablet; Take 1 tablet (10 mg total) by mouth daily before breakfast.  Dispense: 90 tablet; Refill: 1 - cyclobenzaprine (FLEXERIL) 10 MG tablet; Take one tab po qd as needed for back spasm  Dispense: 45 tablet; Refill: 1 - oxyCODONE-acetaminophen (PERCOCET) 7.5-325 MG tablet; Take 1 tablet by mouth every 6 (six) hours as needed for severe pain.  Dispense: 30 tablet; Refill: 0  General Counseling: jerol rufener understanding of the findings of todays visit and agrees with plan of treatment. I have discussed any further diagnostic evaluation that may be needed or ordered today. We also reviewed his medications today. he has been encouraged to call the office with any questions or concerns that should arise related to todays visit.    Orders Placed This Encounter  Procedures   Microscopic Examination   CBC with Differential/Platelet   CMP14+EGFR   Lipid Profile   Vitamin D (25 hydroxy)   TSH + free T4   PSA Total (Reflex To Free)   UA/M w/rflx Culture, Routine   Urine Microalbumin w/creat. ratio    Meds ordered this encounter  Medications   mirtazapine (REMERON) 15 MG tablet    Sig: Take 1 tablet (15 mg total) by mouth at bedtime.     Dispense:  90 tablet    Refill:  1    Note increased dose, please discontinue 7.5 mg tablet.   tirzepatide (MOUNJARO) 7.5 MG/0.5ML Pen    Sig: Inject 7.5 mg into the skin once a week.    Dispense:  6 mL    Refill:  2    Please note increased dose. Discontinue 5 mg injection, fill new script asap.   traMADol (ULTRAM) 50 MG tablet    Sig: Take 1 tablet po BID prn pain    Dispense:  180 tablet    Refill:  1   rosuvastatin (CRESTOR) 5 MG tablet    Sig: Take one tab 2 x a week for high chol    Dispense:  45 tablet    Refill:  1    For future refill   montelukast (SINGULAIR) 10 MG tablet    Sig: Take 1 tablet (10 mg total) by mouth at bedtime.    Dispense:  90 tablet    Refill:  1    For future refills   hydrochlorothiazide (HYDRODIURIL) 12.5 MG tablet    Sig: Take 1 tablet (12.5 mg total) by mouth daily.    Dispense:  90 tablet    Refill:  1    For future refill   glimepiride (AMARYL) 4 MG tablet    Sig: Take one tab by mouth daily    Dispense:  90 tablet    Refill:  1    For future refill   esomeprazole (NEXIUM) 40 MG capsule    Sig: Take 1 capsule (40 mg total) by mouth daily.    Dispense:  90 capsule    Refill:  1    For future refills   dapagliflozin propanediol (FARXIGA) 10 MG TABS tablet    Sig: Take 1 tablet (10 mg total) by mouth daily before breakfast.    Dispense:  90 tablet    Refill:  1    For future refill   cyclobenzaprine (FLEXERIL) 10 MG tablet    Sig: Take one tab po qd as needed for back spasm    Dispense:  45 tablet    Refill:  1    For future refill   oxyCODONE-acetaminophen (PERCOCET) 7.5-325 MG tablet    Sig: Take 1 tablet by mouth every 6 (six) hours as needed for severe pain.    Dispense:  30 tablet    Refill:  0    Return in about 7 weeks (around 07/10/2022) for F/U, Recheck A1C, Khyson [facility]best[facility] PCP.   Total time spent:30 Minutes Time spent includes review of chart, medications, test results, and follow up plan with the patient.  [place] Controlled  Substance Database was reviewed by me.  This patient was seen by Jonetta Osgood, FNP-C in collaboration with Dr. Clayborn Bigness as a part of collaborative care agreement.  Morse Brueggemann R. Valetta Fuller, MSN, FNP-C Internal medicine

## 2022-05-24 LAB — UA/M W/RFLX CULTURE, ROUTINE
Bilirubin, UA: NEGATIVE
Ketones, UA: NEGATIVE
Leukocytes,UA: NEGATIVE
Nitrite, UA: NEGATIVE
Protein,UA: NEGATIVE
RBC, UA: NEGATIVE
Specific Gravity, UA: 1.026 (ref 1.005–1.030)
Urobilinogen, Ur: 0.2 mg/dL (ref 0.2–1.0)
pH, UA: 5.5 (ref 5.0–7.5)

## 2022-05-24 LAB — MICROSCOPIC EXAMINATION
Bacteria, UA: NONE SEEN
Casts: NONE SEEN /lpf
Epithelial Cells (non renal): NONE SEEN /hpf (ref 0–10)
RBC, Urine: NONE SEEN /hpf (ref 0–2)
WBC, UA: NONE SEEN /hpf (ref 0–5)

## 2022-05-24 LAB — MICROALBUMIN / CREATININE URINE RATIO
Creatinine, Urine: 48.9 mg/dL
Microalb/Creat Ratio: <6 mg/g creat (ref 0–29)
Microalbumin, Urine: <3 ug/mL

## 2022-05-26 ENCOUNTER — Other Ambulatory Visit: Payer: Self-pay | Admitting: Nurse Practitioner

## 2022-05-26 DIAGNOSIS — E559 Vitamin D deficiency, unspecified: Secondary | ICD-10-CM | POA: Diagnosis not present

## 2022-05-26 DIAGNOSIS — Z125 Encounter for screening for malignant neoplasm of prostate: Secondary | ICD-10-CM | POA: Diagnosis not present

## 2022-05-26 DIAGNOSIS — Z0001 Encounter for general adult medical examination with abnormal findings: Secondary | ICD-10-CM | POA: Diagnosis not present

## 2022-05-26 DIAGNOSIS — I10 Essential (primary) hypertension: Secondary | ICD-10-CM | POA: Diagnosis not present

## 2022-05-26 DIAGNOSIS — E782 Mixed hyperlipidemia: Secondary | ICD-10-CM | POA: Diagnosis not present

## 2022-05-26 DIAGNOSIS — E1165 Type 2 diabetes mellitus with hyperglycemia: Secondary | ICD-10-CM | POA: Diagnosis not present

## 2022-05-27 LAB — CBC WITH DIFFERENTIAL/PLATELET
Basophils Absolute: 0 10*3/uL (ref 0.0–0.2)
Basos: 1 %
EOS (ABSOLUTE): 0.1 10*3/uL (ref 0.0–0.4)
Eos: 2 %
Hematocrit: 48.7 % (ref 37.5–51.0)
Hemoglobin: 15.9 g/dL (ref 13.0–17.7)
Immature Grans (Abs): 0 10*3/uL (ref 0.0–0.1)
Immature Granulocytes: 0 %
Lymphocytes Absolute: 1.4 10*3/uL (ref 0.7–3.1)
Lymphs: 33 %
MCH: 28 pg (ref 26.6–33.0)
MCHC: 32.6 g/dL (ref 31.5–35.7)
MCV: 86 fL (ref 79–97)
Monocytes Absolute: 0.4 10*3/uL (ref 0.1–0.9)
Monocytes: 9 %
Neutrophils Absolute: 2.4 10*3/uL (ref 1.4–7.0)
Neutrophils: 55 %
Platelets: 209 10*3/uL (ref 150–450)
RBC: 5.68 x10E6/uL (ref 4.14–5.80)
RDW: 15.8 % — ABNORMAL HIGH (ref 11.6–15.4)
WBC: 4.4 10*3/uL (ref 3.4–10.8)

## 2022-05-27 LAB — CMP14+EGFR
ALT: 55 IU/L — ABNORMAL HIGH (ref 0–44)
AST: 33 IU/L (ref 0–40)
Albumin/Globulin Ratio: 2.3 — ABNORMAL HIGH (ref 1.2–2.2)
Albumin: 4.8 g/dL (ref 3.8–4.9)
Alkaline Phosphatase: 108 IU/L (ref 44–121)
BUN/Creatinine Ratio: 13 (ref 9–20)
BUN: 13 mg/dL (ref 6–24)
Bilirubin Total: 0.4 mg/dL (ref 0.0–1.2)
CO2: 21 mmol/L (ref 20–29)
Calcium: 9.4 mg/dL (ref 8.7–10.2)
Chloride: 105 mmol/L (ref 96–106)
Creatinine, Ser: 0.97 mg/dL (ref 0.76–1.27)
Globulin, Total: 2.1 g/dL (ref 1.5–4.5)
Glucose: 142 mg/dL — ABNORMAL HIGH (ref 70–99)
Potassium: 3.9 mmol/L (ref 3.5–5.2)
Sodium: 142 mmol/L (ref 134–144)
Total Protein: 6.9 g/dL (ref 6.0–8.5)
eGFR: 92 mL/min/{1.73_m2} (ref >59)

## 2022-05-27 LAB — LIPID PANEL
Chol/HDL Ratio: 4.6 ratio (ref 0.0–5.0)
Cholesterol, Total: 190 mg/dL (ref 100–199)
HDL: 41 mg/dL (ref >39)
LDL Chol Calc (NIH): 129 mg/dL — ABNORMAL HIGH (ref 0–99)
Triglycerides: 111 mg/dL (ref 0–149)
VLDL Cholesterol Cal: 20 mg/dL (ref 5–40)

## 2022-05-27 LAB — VITAMIN D 25 HYDROXY (VIT D DEFICIENCY, FRACTURES): Vit D, 25-Hydroxy: 21.5 ng/mL — ABNORMAL LOW (ref 30.0–100.0)

## 2022-05-27 LAB — TSH+FREE T4
Free T4: 1.08 ng/dL (ref 0.82–1.77)
TSH: 1.04 u[IU]/mL (ref 0.450–4.500)

## 2022-05-27 LAB — PSA TOTAL (REFLEX TO FREE): Prostate Specific Ag, Serum: 0.3 ng/mL (ref 0.0–4.0)

## 2022-07-01 ENCOUNTER — Encounter: Payer: Self-pay | Admitting: Nurse Practitioner

## 2022-07-10 ENCOUNTER — Ambulatory Visit (INDEPENDENT_AMBULATORY_CARE_PROVIDER_SITE_OTHER): Payer: BC Managed Care – PPO | Admitting: Nurse Practitioner

## 2022-07-10 ENCOUNTER — Encounter: Payer: Self-pay | Admitting: Nurse Practitioner

## 2022-07-10 VITALS — BP 138/70 | HR 84 | Temp 97.8°F | Resp 16 | Ht 75.0 in | Wt 235.4 lb

## 2022-07-10 DIAGNOSIS — E1165 Type 2 diabetes mellitus with hyperglycemia: Secondary | ICD-10-CM

## 2022-07-10 DIAGNOSIS — I10 Essential (primary) hypertension: Secondary | ICD-10-CM

## 2022-07-10 DIAGNOSIS — Z6829 Body mass index (BMI) 29.0-29.9, adult: Secondary | ICD-10-CM

## 2022-07-10 DIAGNOSIS — E663 Overweight: Secondary | ICD-10-CM | POA: Diagnosis not present

## 2022-07-10 LAB — POCT GLYCOSYLATED HEMOGLOBIN (HGB A1C): Hemoglobin A1C: 8.6 % — AB (ref 4.0–5.6)

## 2022-07-10 NOTE — Progress Notes (Signed)
[facility] [place], [place] [address]  Internal MEDICINE  Office Visit Note  Patient Name: Clinton Moore  [id number]  [id number]  Date of Service: 07/10/2022  Chief Complaint  Patient presents with   Follow-up   Depression   Diabetes   Gastroesophageal Reflux   Hypertension    HPI Roxie presents for a follow up visit for diabetes, and hypertension.  Diabetes -- A1c significantly improved to 8.6 from 12.6 in September. Improved by 4.0. Takes jardiance and [place]. No significant weight loss. Stopped drinking juice. Focusing on positive diet and lifestyle changes.  Hypertension -- stable with current medications  Depression -- stable, in good spirits, no issues.      Current Medication: Outpatient Encounter Medications as of 07/10/2022  Medication Sig   amLODipine (NORVASC) 10 MG tablet Take 1 tablet (10 mg total) by mouth daily.   cyclobenzaprine (FLEXERIL) 10 MG tablet Take one tab po qd as needed for back spasm   dapagliflozin propanediol (FARXIGA) 10 MG TABS tablet Take 1 tablet (10 mg total) by mouth daily before breakfast.   esomeprazole (NEXIUM) 40 MG capsule Take 1 capsule (40 mg total) by mouth daily.   fluticasone (FLONASE) 50 MCG/ACT nasal spray Place 1-2 sprays into both nostrils daily as needed. For allergies.   glimepiride (AMARYL) 4 MG tablet Take one tab by mouth daily   glucose blood (ACCU-CHEK GUIDE) test strip Use as instructed   hydrochlorothiazide (HYDRODIURIL) 12.5 MG tablet Take 1 tablet (12.5 mg total) by mouth daily.   Lancets (ACCU-CHEK MULTICLIX) lancets Use as instructed   Melatonin-Pyridoxine 5-1 MG TABS Take 1 tablet by mouth at bedtime as needed. For sleep.   meloxicam (MOBIC) 15 MG tablet Take 1 tablet (15 mg total) by mouth daily.   mirtazapine (REMERON) 15 MG tablet Take 1 tablet (15 mg total) by mouth at bedtime.   montelukast (SINGULAIR) 10 MG tablet Take 1 tablet (10 mg total) by mouth at bedtime.    Multiple Vitamin (MULTIVITAMIN WITH MINERALS) TABS tablet Take 1 tablet by mouth daily. men multivitamin   oxyCODONE-acetaminophen (PERCOCET) 7.5-325 MG tablet Take 1 tablet by mouth every 6 (six) hours as needed for severe pain.   rosuvastatin (CRESTOR) 5 MG tablet Take one tab 2 x a week for high chol   tirzepatide (MOUNJARO) 7.5 MG/0.5ML Pen Inject 7.5 mg into the skin once a week.   traMADol (ULTRAM) 50 MG tablet Take 1 tablet po BID prn pain   No facility-administered encounter medications on file as of 07/10/2022.    Surgical History: Past Surgical History:  Procedure Laterality Date   FOOT MASS EXCISION Right 2007   PLANTAR FASCIA RELEASE Left 06/09/2016   Procedure: Radical excision plantar fibromas of left plantar arch and left hallux;  Surgeon: Sharlotte Alamo, DPM;  Location: [facility] ORS;  Service: Podiatry;  Laterality: Left;    Medical History: Past Medical History:  Diagnosis Date   Anxiety    Depression    Diabetes mellitus without complication (HCC)    GERD (gastroesophageal reflux disease)    Headache    history of migraines   Hypertension     Family History: Family History  Problem Relation Age of Onset   Diabetes Father    Hypertension Father     Social History   Socioeconomic History   Marital status: Married    Spouse name: Not on file   Number of children: Not on file   Years of education: Not on file  Highest education level: Not on file  Occupational History   Not on file  Tobacco Use   Smoking status: Some Days    Types: Cigarettes   Smokeless tobacco: Never   Tobacco comments:    social smoker   Substance and Sexual Activity   Alcohol use: Yes    Alcohol/week: 3.0 standard drinks of alcohol    Types: 3 Cans of beer per week   Drug use: No   Sexual activity: Never  Other Topics Concern   Not on file  Social History Narrative   Not on file   Social Determinants of Health   Financial Resource Strain: Not on file  Food Insecurity: Not  on file  Transportation Needs: Not on file  Physical Activity: Not on file  Stress: Not on file  Social Connections: Not on file  Intimate Partner Violence: Not on file      Review of Systems  Constitutional:  Negative for chills, fatigue and unexpected weight change.  HENT:  Negative for congestion, rhinorrhea, sneezing and sore throat.   Eyes:  Negative for redness.  Respiratory:  Negative for cough, chest tightness and shortness of breath.   Cardiovascular:  Negative for chest pain and palpitations.  Gastrointestinal:  Negative for abdominal pain, constipation, diarrhea, nausea and vomiting.  Genitourinary:  Negative for dysuria and frequency.  Musculoskeletal:  Positive for back pain (chronic). Negative for arthralgias, joint swelling and neck pain.  Skin:  Negative for rash.  Neurological: Negative.  Negative for tremors and numbness.  Hematological:  Negative for adenopathy. Does not bruise/bleed easily.  Psychiatric/Behavioral:  Negative for behavioral problems (Depression), sleep disturbance and suicidal ideas. The patient is not nervous/anxious.     Vital Signs: BP 138/70 Comment: 153/98  Pulse 84   Temp 97.8 F (36.6 C)   Resp 16   Ht '6\' 3"'$  (1.905 m)   Wt 235 lb 6.4 oz (106.8 kg)   SpO2 94%   BMI 29.42 kg/m    Physical Exam Vitals reviewed.  Constitutional:      General: He is not in acute distress.    Appearance: Normal appearance. He is obese. He is not ill-appearing.  HENT:     Head: Normocephalic and atraumatic.  Eyes:     Pupils: Pupils are equal, round, and reactive to light.  Cardiovascular:     Rate and Rhythm: Normal rate and regular rhythm.  Pulmonary:     Effort: Pulmonary effort is normal. No respiratory distress.  Neurological:     Mental Status: He is alert and oriented to person, place, and time.  Psychiatric:        Mood and Affect: Mood normal.        Behavior: Behavior normal.        Assessment/Plan: 1. Uncontrolled type 2  diabetes mellitus with hyperglycemia (HCC) Significant improvement, at 8.6 now. Follow up in 3 months for repeat a1c - POCT glycosylated hemoglobin (Hb A1C)  2. Essential hypertension Stable, continue current medications as prescribed.   3. Overweight with body mass index (BMI) of 29 to 29.9 in adult No weight loss, gained 4 lbs, should start coming down as his A1c continues to improve.    General Counseling: jurrell royster understanding of the findings of todays visit and agrees with plan of treatment. I have discussed any further diagnostic evaluation that may be needed or ordered today. We also reviewed his medications today. he has been encouraged to call the office with any questions or concerns that should  arise related to todays visit.    Orders Placed This Encounter  Procedures   POCT glycosylated hemoglobin (Hb A1C)    No orders of the defined types were placed in this encounter.   Return in about 3 months (around 10/09/2022) for F/U, Recheck A1C, Alonnie Bieker PCP.   Total time spent:30 Minutes Time spent includes review of chart, medications, test results, and follow up plan with the patient.   [place] Controlled Substance Database was reviewed by me.  This patient was seen by Jonetta Osgood, FNP-C in collaboration with Dr. Clayborn Bigness as a part of collaborative care agreement.   Petar Mucci R. Valetta Fuller, MSN, FNP-C Internal medicine

## 2022-07-18 ENCOUNTER — Encounter: Payer: Self-pay | Admitting: Nurse Practitioner

## 2022-08-16 ENCOUNTER — Other Ambulatory Visit: Payer: Self-pay | Admitting: Nurse Practitioner

## 2022-08-16 DIAGNOSIS — Z760 Encounter for issue of repeat prescription: Secondary | ICD-10-CM

## 2022-08-21 ENCOUNTER — Other Ambulatory Visit: Payer: Self-pay

## 2022-08-21 DIAGNOSIS — Z760 Encounter for issue of repeat prescription: Secondary | ICD-10-CM

## 2022-08-21 MED ORDER — TIRZEPATIDE 7.5 MG/0.5ML ~~LOC~~ SOAJ: 7.5000 mg | SUBCUTANEOUS | 2 refills | Status: DC

## 2022-08-23 ENCOUNTER — Telehealth (INDEPENDENT_AMBULATORY_CARE_PROVIDER_SITE_OTHER): Payer: BC Managed Care – PPO | Admitting: Nurse Practitioner

## 2022-08-23 ENCOUNTER — Encounter: Payer: Self-pay | Admitting: Nurse Practitioner

## 2022-08-23 VITALS — Resp 16 | Ht 75.0 in | Wt 240.0 lb

## 2022-08-23 DIAGNOSIS — J0110 Acute frontal sinusitis, unspecified: Secondary | ICD-10-CM | POA: Diagnosis not present

## 2022-08-23 MED ORDER — ACETAMINOPHEN-CODEINE 300-30 MG PO TABS: 1.0000 | ORAL_TABLET | ORAL | 0 refills | Status: DC | PRN

## 2022-08-23 MED ORDER — PREDNISONE 10 MG (21) PO TBPK: ORAL_TABLET | ORAL | 0 refills | Status: DC

## 2022-08-23 MED ORDER — AMOXICILLIN-POT CLAVULANATE 875-125 MG PO TABS: 1.0000 | ORAL_TABLET | Freq: Two times a day (BID) | ORAL | 0 refills | Status: DC

## 2022-08-23 NOTE — Progress Notes (Signed)
[facility] [place], [place] [address]  Internal MEDICINE  Telephone Visit  Patient Name: Clinton Moore  [id number]  [id number]  Date of Service: 08/23/2022  I connected with the patient at 1630 by telephone and verified the patients identity using two identifiers.   I discussed the limitations, risks, security and privacy concerns of performing an evaluation and management service by telephone and the availability of in person appointments. I also discussed with the patient that there may be a patient responsible charge related to the service.  The patient expressed understanding and agrees to proceed.    Chief Complaint  Patient presents with   Telephone Screen    Cough, congestion,  covid test negative.  [phone]    Telephone Assessment   Cough    HPI Clinton Moore presents for a telehealth virtual visit for symptoms of sinusitis.  Onset of symptoms was 2 weeks ago.  Reports cough, sinus pressure, nasal congestion, fatigue, and chest congestion.   Current Medication: Outpatient Encounter Medications as of 08/23/2022  Medication Sig   acetaminophen-codeine (TYLENOL/CODEINE #3) 300-30 MG tablet Take 1 tablet by mouth every 4 (four) hours as needed for moderate pain.   amLODipine (NORVASC) 10 MG tablet Take 1 tablet (10 mg total) by mouth daily.   amoxicillin-clavulanate (AUGMENTIN) 875-125 MG tablet Take 1 tablet by mouth 2 (two) times daily.   cyclobenzaprine (FLEXERIL) 10 MG tablet Take one tab po qd as needed for back spasm   dapagliflozin propanediol (FARXIGA) 10 MG TABS tablet Take 1 tablet (10 mg total) by mouth daily before breakfast.   esomeprazole (NEXIUM) 40 MG capsule Take 1 capsule (40 mg total) by mouth daily.   fluticasone (FLONASE) 50 MCG/ACT nasal spray Place 1-2 sprays into both nostrils daily as needed. For allergies.   glimepiride (AMARYL) 4 MG tablet Take one tab by mouth daily   glucose blood (ACCU-CHEK GUIDE) test strip Use as  instructed   hydrochlorothiazide (HYDRODIURIL) 12.5 MG tablet Take 1 tablet (12.5 mg total) by mouth daily.   Lancets (ACCU-CHEK MULTICLIX) lancets Use as instructed   Melatonin-Pyridoxine 5-1 MG TABS Take 1 tablet by mouth at bedtime as needed. For sleep.   meloxicam (MOBIC) 15 MG tablet Take 1 tablet (15 mg total) by mouth daily.   mirtazapine (REMERON) 15 MG tablet Take 1 tablet (15 mg total) by mouth at bedtime.   montelukast (SINGULAIR) 10 MG tablet TAKE 1 TABLET AT BEDTIME   Multiple Vitamin (MULTIVITAMIN WITH MINERALS) TABS tablet Take 1 tablet by mouth daily. men multivitamin   oxyCODONE-acetaminophen (PERCOCET) 7.5-325 MG tablet Take 1 tablet by mouth every 6 (six) hours as needed for severe pain.   predniSONE (STERAPRED UNI-PAK 21 TAB) 10 MG (21) TBPK tablet Use as directed for 6 days   rosuvastatin (CRESTOR) 5 MG tablet Take one tab 2 x a week for high chol   tirzepatide (MOUNJARO) 7.5 MG/0.5ML Pen Inject 7.5 mg into the skin once a week.   traMADol (ULTRAM) 50 MG tablet Take 1 tablet po BID prn pain   No facility-administered encounter medications on file as of 08/23/2022.    Surgical History: Past Surgical History:  Procedure Laterality Date   FOOT MASS EXCISION Right 2007   PLANTAR FASCIA RELEASE Left 06/09/2016   Procedure: Radical excision plantar fibromas of left plantar arch and left hallux;  Surgeon: Sharlotte Alamo, DPM;  Location: [facility] ORS;  Service: Podiatry;  Laterality: Left;    Medical History: Past Medical History:  Diagnosis Date  Anxiety    Depression    Diabetes mellitus without complication (HCC)    GERD (gastroesophageal reflux disease)    Headache    history of migraines   Hypertension     Family History: Family History  Problem Relation Age of Onset   Diabetes Father    Hypertension Father     Social History   Socioeconomic History   Marital status: Married    Spouse name: Not on file   Number of children: Not on file   Years of education:  Not on file   Highest education level: Not on file  Occupational History   Not on file  Tobacco Use   Smoking status: Some Days    Types: Cigarettes   Smokeless tobacco: Never   Tobacco comments:    social smoker   Substance and Sexual Activity   Alcohol use: Yes    Alcohol/week: 3.0 standard drinks of alcohol    Types: 3 Cans of beer per week   Drug use: No   Sexual activity: Never  Other Topics Concern   Not on file  Social History Narrative   Not on file   Social Determinants of Health   Financial Resource Strain: Not on file  Food Insecurity: Not on file  Transportation Needs: Not on file  Physical Activity: Not on file  Stress: Not on file  Social Connections: Not on file  Intimate Partner Violence: Not on file      Review of Systems  Constitutional:  Positive for fatigue. Negative for chills and fever.  HENT:  Positive for congestion, ear pain, postnasal drip, rhinorrhea, sinus pressure and sneezing. Negative for sinus pain and sore throat.   Respiratory:  Positive for cough, chest tightness (yellow mucous) and wheezing. Negative for shortness of breath.   Cardiovascular: Negative.  Negative for chest pain and palpitations.  Gastrointestinal: Negative.   Neurological:  Negative for headaches.    Vital Signs: Resp 16   Ht '6\' 3"'$  (1.905 m)   Wt 240 lb (108.9 kg)   BMI 30.00 kg/m    Observation/Objective: He is alert and oriented and engages in conversation appropriately. No acute distress noted.     Assessment/Plan: 1. Acute non-recurrent frontal sinusitis Empiric antibiotic tx prescribed as well as a prednisone taper for inflammation and tylenol #3 for cough - amoxicillin-clavulanate (AUGMENTIN) 875-125 MG tablet; Take 1 tablet by mouth 2 (two) times daily.  Dispense: 20 tablet; Refill: 0 - predniSONE (STERAPRED UNI-PAK 21 TAB) 10 MG (21) TBPK tablet; Use as directed for 6 days  Dispense: 21 tablet; Refill: 0 - acetaminophen-codeine (TYLENOL/CODEINE  #3) 300-30 MG tablet; Take 1 tablet by mouth every 4 (four) hours as needed for moderate pain.  Dispense: 30 tablet; Refill: 0   General Counseling: Clinton Moore understanding of the findings of today's phone visit and agrees with plan of treatment. I have discussed any further diagnostic evaluation that may be needed or ordered today. We also reviewed his medications today. he has been encouraged to call the office with any questions or concerns that should arise related to todays visit.  No follow-ups on file.   No orders of the defined types were placed in this encounter.   Meds ordered this encounter  Medications   amoxicillin-clavulanate (AUGMENTIN) 875-125 MG tablet    Sig: Take 1 tablet by mouth 2 (two) times daily.    Dispense:  20 tablet    Refill:  0   predniSONE (STERAPRED UNI-PAK 21 TAB) 10 MG (21)  TBPK tablet    Sig: Use as directed for 6 days    Dispense:  21 tablet    Refill:  0   acetaminophen-codeine (TYLENOL/CODEINE #3) 300-30 MG tablet    Sig: Take 1 tablet by mouth every 4 (four) hours as needed for moderate pain.    Dispense:  30 tablet    Refill:  0      Time spent:10 Minutes Time spent with patient included reviewing progress notes, labs, imaging studies, and discussing plan for follow up.  [place] Controlled Substance Database was reviewed by me for overdose risk score (ORS) if appropriate.  This patient was seen by Jonetta Osgood, FNP-C in collaboration with Dr. Clayborn Bigness as a part of collaborative care agreement.  Kamon Fahr R. Valetta Fuller, MSN, FNP-C Internal medicine

## 2022-09-06 ENCOUNTER — Other Ambulatory Visit: Payer: Self-pay

## 2022-09-06 DIAGNOSIS — Z760 Encounter for issue of repeat prescription: Secondary | ICD-10-CM

## 2022-09-06 MED ORDER — MIRTAZAPINE 15 MG PO TABS: 15.0000 mg | ORAL_TABLET | Freq: Every day | ORAL | 1 refills | Status: DC

## 2022-09-23 ENCOUNTER — Other Ambulatory Visit: Payer: Self-pay | Admitting: Nurse Practitioner

## 2022-09-23 DIAGNOSIS — Z760 Encounter for issue of repeat prescription: Secondary | ICD-10-CM

## 2022-10-12 ENCOUNTER — Ambulatory Visit: Payer: BC Managed Care – PPO | Admitting: Nurse Practitioner

## 2022-10-12 ENCOUNTER — Encounter: Payer: Self-pay | Admitting: Nurse Practitioner

## 2022-10-12 VITALS — BP 140/98 | HR 100 | Temp 97.7°F | Resp 16 | Ht 75.0 in | Wt 229.0 lb

## 2022-10-12 DIAGNOSIS — E1165 Type 2 diabetes mellitus with hyperglycemia: Secondary | ICD-10-CM

## 2022-10-12 DIAGNOSIS — E663 Overweight: Secondary | ICD-10-CM

## 2022-10-12 DIAGNOSIS — M549 Dorsalgia, unspecified: Secondary | ICD-10-CM | POA: Diagnosis not present

## 2022-10-12 DIAGNOSIS — Z6828 Body mass index (BMI) 28.0-28.9, adult: Secondary | ICD-10-CM

## 2022-10-12 DIAGNOSIS — G8929 Other chronic pain: Secondary | ICD-10-CM | POA: Diagnosis not present

## 2022-10-12 LAB — POCT GLYCOSYLATED HEMOGLOBIN (HGB A1C): Hemoglobin A1C: 9.5 % — AB (ref 4.0–5.6)

## 2022-10-12 MED ORDER — TIRZEPATIDE 10 MG/0.5ML ~~LOC~~ SOAJ: 10.0000 mg | SUBCUTANEOUS | 2 refills | Status: DC

## 2022-10-12 MED ORDER — OXYCODONE-ACETAMINOPHEN 7.5-325 MG PO TABS: 1.0000 | ORAL_TABLET | Freq: Four times a day (QID) | ORAL | 0 refills | Status: DC | PRN

## 2022-10-12 NOTE — Progress Notes (Signed)
[facility] [place],  [address]  Internal MEDICINE  Office Visit Note  Patient Name: Clinton Moore  [id number]  [id number]  Date of Service: 10/12/2022  Chief Complaint  Patient presents with   Diabetes   Depression   Gastroesophageal Reflux   Hypertension    HPI Vashawn presents for a follow-up visit for diabetes, back pain and weight loss.  Diabetes -- A1c increased to 9.5, currently on mounjaro 7.5 mg daily. Wants to increase dose to 10 mg weekly.  Chronic back pain -- need refill of percocet.  Weight loss -- lost 11 lbs on mounjaro so far.     Current Medication: Outpatient Encounter Medications as of 10/12/2022  Medication Sig   acetaminophen-codeine (TYLENOL/CODEINE #3) 300-30 MG tablet Take 1 tablet by mouth every 4 (four) hours as needed for moderate pain.   amLODipine (NORVASC) 10 MG tablet Take 1 tablet (10 mg total) by mouth daily.   amoxicillin-clavulanate (AUGMENTIN) 875-125 MG tablet Take 1 tablet by mouth 2 (two) times daily.   cyclobenzaprine (FLEXERIL) 10 MG tablet Take one tab po qd as needed for back spasm   dapagliflozin propanediol (FARXIGA) 10 MG TABS tablet Take 1 tablet (10 mg total) by mouth daily before breakfast.   esomeprazole (NEXIUM) 40 MG capsule TAKE 1 CAPSULE BY MOUTH DAILY   fluticasone (FLONASE) 50 MCG/ACT nasal spray Place 1-2 sprays into both nostrils daily as needed. For allergies.   glimepiride (AMARYL) 4 MG tablet Take one tab by mouth daily   glucose blood (ACCU-CHEK GUIDE) test strip Use as instructed   hydrochlorothiazide (HYDRODIURIL) 12.5 MG tablet Take 1 tablet (12.5 mg total) by mouth daily.   Lancets (ACCU-CHEK MULTICLIX) lancets Use as instructed   Melatonin-Pyridoxine 5-1 MG TABS Take 1 tablet by mouth at bedtime as needed. For sleep.   meloxicam (MOBIC) 15 MG tablet Take 1 tablet (15 mg total) by mouth daily.   mirtazapine (REMERON) 15 MG tablet Take 1 tablet (15 mg total) by mouth at  bedtime.   montelukast (SINGULAIR) 10 MG tablet TAKE 1 TABLET AT BEDTIME   Multiple Vitamin (MULTIVITAMIN WITH MINERALS) TABS tablet Take 1 tablet by mouth daily. men multivitamin   oxyCODONE-acetaminophen (PERCOCET) 7.5-325 MG tablet Take 1 tablet by mouth every 6 (six) hours as needed for severe pain.   predniSONE (STERAPRED UNI-PAK 21 TAB) 10 MG (21) TBPK tablet Use as directed for 6 days   rosuvastatin (CRESTOR) 5 MG tablet Take one tab 2 x a week for high chol   tirzepatide (MOUNJARO) 10 MG/0.5ML Pen Inject 10 mg into the skin once a week.   traMADol (ULTRAM) 50 MG tablet Take 1 tablet po BID prn pain   [DISCONTINUED] tirzepatide (MOUNJARO) 7.5 MG/0.5ML Pen Inject 7.5 mg into the skin once a week.   No facility-administered encounter medications on file as of 10/12/2022.    Surgical History: Past Surgical History:  Procedure Laterality Date   FOOT MASS EXCISION Right 2007   PLANTAR FASCIA RELEASE Left 06/09/2016   Procedure: Radical excision plantar fibromas of left plantar arch and left hallux;  Surgeon: Sharlotte Alamo, DPM;  Location: [facility] ORS;  Service: Podiatry;  Laterality: Left;    Medical History: Past Medical History:  Diagnosis Date   Anxiety    Depression    Diabetes mellitus without complication (HCC)    GERD (gastroesophageal reflux disease)    Headache    history of migraines   Hypertension     Family History: Family History  Problem Relation Age of Onset   Diabetes Father    Hypertension Father     Social History   Socioeconomic History   Marital status: Married    Spouse name: Not on file   Number of children: Not on file   Years of education: Not on file   Highest education level: Not on file  Occupational History   Not on file  Tobacco Use   Smoking status: Some Days    Types: Cigarettes   Smokeless tobacco: Never   Tobacco comments:    social smoker   Substance and Sexual Activity   Alcohol use: Yes    Alcohol/week: 3.0 standard drinks of  alcohol    Types: 3 Cans of beer per week   Drug use: No   Sexual activity: Never  Other Topics Concern   Not on file  Social History Narrative   Not on file   Social Determinants of Health   Financial Resource Strain: Not on file  Food Insecurity: Not on file  Transportation Needs: Not on file  Physical Activity: Not on file  Stress: Not on file  Social Connections: Not on file  Intimate Partner Violence: Not on file      Review of Systems  Constitutional:  Negative for chills, fatigue and unexpected weight change.  HENT:  Negative for congestion, rhinorrhea, sneezing and sore throat.   Eyes:  Negative for redness.  Respiratory:  Negative for cough, chest tightness and shortness of breath.   Cardiovascular:  Negative for chest pain and palpitations.  Gastrointestinal:  Negative for abdominal pain, constipation, diarrhea, nausea and vomiting.  Genitourinary:  Negative for dysuria and frequency.  Musculoskeletal:  Positive for back pain (chronic). Negative for arthralgias, joint swelling and neck pain.  Skin:  Negative for rash.  Neurological: Negative.  Negative for tremors and numbness.  Hematological:  Negative for adenopathy. Does not bruise/bleed easily.  Psychiatric/Behavioral:  Negative for behavioral problems (Depression), sleep disturbance and suicidal ideas. The patient is not nervous/anxious.     Vital Signs: BP (!) 140/98   Pulse 100   Temp 97.7 F (36.5 C)   Resp 16   Ht '6\' 3"'$  (1.905 m)   Wt 229 lb (103.9 kg)   SpO2 95%   BMI 28.62 kg/m    Physical Exam Vitals reviewed.  Constitutional:      General: He is not in acute distress.    Appearance: Normal appearance. He is obese. He is not ill-appearing.  HENT:     Head: Normocephalic and atraumatic.  Eyes:     Pupils: Pupils are equal, round, and reactive to light.  Cardiovascular:     Rate and Rhythm: Normal rate and regular rhythm.  Pulmonary:     Effort: Pulmonary effort is normal. No  respiratory distress.  Neurological:     Mental Status: He is alert and oriented to person, place, and time.  Psychiatric:        Mood and Affect: Mood normal.        Behavior: Behavior normal.        Assessment/Plan: 1. Uncontrolled type 2 diabetes mellitus with hyperglycemia (HCC) A1c is up at 9.5, mounjaro dose increased to 10 mg weekly. Encouraged patient to continue positive diet and lifestyle modifications that will help his blood glucose level remain steady. Follow up in 4 months to repeat a1c - POCT glycosylated hemoglobin (Hb A1C) - tirzepatide (MOUNJARO) 10 MG/0.5ML Pen; Inject 10 mg into the skin once a week.  Dispense: 6  mL; Refill: 2  2. Chronic back pain greater than 3 months duration Continue percocet as prescribed.  - oxyCODONE-acetaminophen (PERCOCET) 7.5-325 MG tablet; Take 1 tablet by mouth every 6 (six) hours as needed for severe pain.  Dispense: 30 tablet; Refill: 0  3. Overweight with body mass index (BMI) of 28 to 28.9 in adult Lost 11 lbs, mounjaro dose increased, will most likely have increased weight loss, will reassess in 4 months.  - tirzepatide (MOUNJARO) 10 MG/0.5ML Pen; Inject 10 mg into the skin once a week.  Dispense: 6 mL; Refill: 2   General Counseling: Renata Caprice understanding of the findings of todays visit and agrees with plan of treatment. I have discussed any further diagnostic evaluation that may be needed or ordered today. We also reviewed his medications today. he has been encouraged to call the office with any questions or concerns that should arise related to todays visit.    Orders Placed This Encounter  Procedures   POCT glycosylated hemoglobin (Hb A1C)    Meds ordered this encounter  Medications   tirzepatide (MOUNJARO) 10 MG/0.5ML Pen    Sig: Inject 10 mg into the skin once a week.    Dispense:  6 mL    Refill:  2    Discontinue 7.5 mg dose and fill new script for 10 mg dose asap    Return in about 4 months (around  02/11/2023) for F/U, Recheck A1C, Noeli Lavery PCP.   Total time spent:30 Minutes Time spent includes review of chart, medications, test results, and follow up plan with the patient.   [place] Controlled Substance Database was reviewed by me.  This patient was seen by Jonetta Osgood, FNP-C in collaboration with Dr. Clayborn Bigness as a part of collaborative care agreement.   Loney Domingo R. Valetta Fuller, MSN, FNP-C Internal medicine

## 2022-10-30 ENCOUNTER — Other Ambulatory Visit: Payer: Self-pay

## 2022-10-30 DIAGNOSIS — E663 Overweight: Secondary | ICD-10-CM

## 2022-10-30 DIAGNOSIS — E1165 Type 2 diabetes mellitus with hyperglycemia: Secondary | ICD-10-CM

## 2022-10-30 MED ORDER — TIRZEPATIDE 10 MG/0.5ML ~~LOC~~ SOAJ: 10.0000 mg | SUBCUTANEOUS | 2 refills | Status: DC

## 2022-10-30 MED ORDER — TIRZEPATIDE 10 MG/0.5ML ~~LOC~~ SOAJ
10.0000 mg | SUBCUTANEOUS | 2 refills | Status: DC
Start: 2022-10-30 — End: 2023-01-11

## 2022-11-10 ENCOUNTER — Telehealth: Payer: Self-pay

## 2022-11-12 MED ORDER — TIRZEPATIDE 5 MG/0.5ML ~~LOC~~ SOAJ: 5.0000 mg | SUBCUTANEOUS | 0 refills | Status: DC

## 2022-11-13 ENCOUNTER — Telehealth: Payer: Self-pay

## 2022-11-13 NOTE — Telephone Encounter (Signed)
Pt advised that we sent pres please and let us know that he able to get med

## 2022-11-14 MED ORDER — OZEMPIC (1 MG/DOSE) 4 MG/3ML ~~LOC~~ SOPN: 1.0000 mg | PEN_INJECTOR | SUBCUTANEOUS | 2 refills | Status: DC

## 2022-11-15 NOTE — Telephone Encounter (Signed)
Pt notified that we change med and sent

## 2022-12-06 ENCOUNTER — Other Ambulatory Visit: Payer: Self-pay | Admitting: Nurse Practitioner

## 2022-12-06 DIAGNOSIS — Z760 Encounter for issue of repeat prescription: Secondary | ICD-10-CM

## 2022-12-17 ENCOUNTER — Other Ambulatory Visit: Payer: Self-pay | Admitting: Nurse Practitioner

## 2022-12-17 DIAGNOSIS — Z760 Encounter for issue of repeat prescription: Secondary | ICD-10-CM

## 2023-01-10 ENCOUNTER — Telehealth: Payer: Self-pay

## 2023-01-10 DIAGNOSIS — Z760 Encounter for issue of repeat prescription: Secondary | ICD-10-CM

## 2023-01-11 ENCOUNTER — Other Ambulatory Visit: Payer: Self-pay

## 2023-01-11 MED ORDER — TRAMADOL HCL 50 MG PO TABS
ORAL_TABLET | ORAL | 0 refills | Status: DC
Start: 2023-01-11 — End: 2024-04-23

## 2023-01-11 MED ORDER — OZEMPIC (1 MG/DOSE) 4 MG/3ML ~~LOC~~ SOPN: 1.0000 mg | PEN_INJECTOR | SUBCUTANEOUS | 2 refills | Status: DC

## 2023-01-12 NOTE — Telephone Encounter (Signed)
Done

## 2023-01-18 ENCOUNTER — Encounter: Payer: Self-pay | Admitting: Nurse Practitioner

## 2023-01-18 ENCOUNTER — Ambulatory Visit (INDEPENDENT_AMBULATORY_CARE_PROVIDER_SITE_OTHER): Payer: BC Managed Care – PPO | Admitting: Nurse Practitioner

## 2023-01-18 VITALS — BP 140/80 | HR 100 | Temp 97.5°F | Resp 16 | Ht 75.0 in | Wt 226.2 lb

## 2023-01-18 DIAGNOSIS — J302 Other seasonal allergic rhinitis: Secondary | ICD-10-CM | POA: Diagnosis not present

## 2023-01-18 DIAGNOSIS — E1165 Type 2 diabetes mellitus with hyperglycemia: Secondary | ICD-10-CM

## 2023-01-18 DIAGNOSIS — G8929 Other chronic pain: Secondary | ICD-10-CM

## 2023-01-18 DIAGNOSIS — M5441 Lumbago with sciatica, right side: Secondary | ICD-10-CM

## 2023-01-18 DIAGNOSIS — M5442 Lumbago with sciatica, left side: Secondary | ICD-10-CM | POA: Diagnosis not present

## 2023-01-18 DIAGNOSIS — Z760 Encounter for issue of repeat prescription: Secondary | ICD-10-CM

## 2023-01-18 MED ORDER — FLUTICASONE PROPIONATE 50 MCG/ACT NA SUSP
1.0000 | Freq: Every day | NASAL | 3 refills | Status: AC | PRN
Start: 2023-01-18 — End: ?

## 2023-01-18 MED ORDER — OXYCODONE-ACETAMINOPHEN 7.5-325 MG PO TABS
1.0000 | ORAL_TABLET | Freq: Four times a day (QID) | ORAL | 0 refills | Status: DC | PRN
Start: 2023-01-18 — End: 2023-09-13

## 2023-01-18 MED ORDER — CYCLOBENZAPRINE HCL 10 MG PO TABS
ORAL_TABLET | ORAL | 1 refills | Status: DC
Start: 2023-01-18 — End: 2023-11-29

## 2023-01-18 MED ORDER — OZEMPIC (1 MG/DOSE) 4 MG/3ML ~~LOC~~ SOPN
1.0000 mg | PEN_INJECTOR | SUBCUTANEOUS | 2 refills | Status: DC
Start: 2023-01-18 — End: 2023-02-12

## 2023-01-18 NOTE — Progress Notes (Signed)
[facility] [address] [place], [place] [address]  Internal MEDICINE  Office Visit Note  Patient Name: Clinton Moore  [id number]  [id number]  Date of Service: 01/18/2023  Chief Complaint  Patient presents with   Follow-up    HPI Terryn presents for a follow-up visit for chronic back pain Needs Fmla for chronic back pain. Requesting continuous FMLA for this week and then intermittent after that for a chronic condition.  He takes chronic medication for back pain including percocet and cyclobenzaprine Chronic back pain --- started in 2007 or earlier. Most recent MRI was done in 2009 showing multilevel disc degeneration, protrusions and facet arthropathy. Takes percocet and cyclobenzaprine chronically for symptoms relief. Need updated imaging.   Current Medication: Outpatient Encounter Medications as of 01/18/2023  Medication Sig   acetaminophen-codeine (TYLENOL/CODEINE #3) 300-30 MG tablet Take 1 tablet by mouth every 4 (four) hours as needed for moderate pain.   amLODipine (NORVASC) 10 MG tablet Take 1 tablet (10 mg total) by mouth daily.   esomeprazole (NEXIUM) 40 MG capsule TAKE 1 CAPSULE BY MOUTH DAILY   FARXIGA 10 MG TABS tablet TAKE 1 TABLET DAILY BEFORE BREAKFAST   glimepiride (AMARYL) 4 MG tablet TAKE 1 TABLET DAILY   glucose blood (ACCU-CHEK GUIDE) test strip Use as instructed   hydrochlorothiazide (HYDRODIURIL) 12.5 MG tablet Take 1 tablet (12.5 mg total) by mouth daily.   Lancets (ACCU-CHEK MULTICLIX) lancets Use as instructed   Melatonin-Pyridoxine 5-1 MG TABS Take 1 tablet by mouth at bedtime as needed. For sleep.   meloxicam (MOBIC) 15 MG tablet Take 1 tablet (15 mg total) by mouth daily.   mirtazapine (REMERON) 15 MG tablet Take 1 tablet (15 mg total) by mouth at bedtime.   montelukast (SINGULAIR) 10 MG tablet TAKE 1 TABLET AT BEDTIME   Multiple Vitamin (MULTIVITAMIN WITH MINERALS) TABS tablet Take 1 tablet by mouth daily. men multivitamin    rosuvastatin (CRESTOR) 5 MG tablet Take one tab 2 x a week for high chol   traMADol (ULTRAM) 50 MG tablet Take 1 tablet po BID prn pain   [DISCONTINUED] amoxicillin-clavulanate (AUGMENTIN) 875-125 MG tablet Take 1 tablet by mouth 2 (two) times daily.   [DISCONTINUED] cyclobenzaprine (FLEXERIL) 10 MG tablet Take one tab po qd as needed for back spasm   [DISCONTINUED] fluticasone (FLONASE) 50 MCG/ACT nasal spray Place 1-2 sprays into both nostrils daily as needed. For allergies.   [DISCONTINUED] oxyCODONE-acetaminophen (PERCOCET) 7.5-325 MG tablet Take 1 tablet by mouth every 6 (six) hours as needed for severe pain.   [DISCONTINUED] predniSONE (STERAPRED UNI-PAK 21 TAB) 10 MG (21) TBPK tablet Use as directed for 6 days   [DISCONTINUED] Semaglutide, 1 MG/DOSE, (OZEMPIC, 1 MG/DOSE,) 4 MG/3ML SOPN Inject 1 mg into the skin once a week.   cyclobenzaprine (FLEXERIL) 10 MG tablet Take one tab po qd as needed for back spasm   fluticasone (FLONASE) 50 MCG/ACT nasal spray Place 1-2 sprays into both nostrils daily as needed. For allergies.   oxyCODONE-acetaminophen (PERCOCET) 7.5-325 MG tablet Take 1 tablet by mouth every 6 (six) hours as needed for severe pain.   Semaglutide, 1 MG/DOSE, (OZEMPIC, 1 MG/DOSE,) 4 MG/3ML SOPN Inject 1 mg into the skin once a week.   No facility-administered encounter medications on file as of 01/18/2023.    Surgical History: Past Surgical History:  Procedure Laterality Date   FOOT MASS EXCISION Right 2007   PLANTAR FASCIA RELEASE Left 06/09/2016   Procedure: Radical excision plantar fibromas of left plantar  arch and left hallux;  Surgeon: Linus Galas, DPM;  Location: [facility] ORS;  Service: Podiatry;  Laterality: Left;    Medical History: Past Medical History:  Diagnosis Date   Anxiety    Depression    Diabetes mellitus without complication (HCC)    GERD (gastroesophageal reflux disease)    Headache    history of migraines   Hypertension     Family History: Family  History  Problem Relation Age of Onset   Diabetes Father    Hypertension Father     Social History   Socioeconomic History   Marital status: Married    Spouse name: Not on file   Number of children: Not on file   Years of education: Not on file   Highest education level: Not on file  Occupational History   Not on file  Tobacco Use   Smoking status: Some Days    Types: Cigarettes   Smokeless tobacco: Never   Tobacco comments:    social smoker   Substance and Sexual Activity   Alcohol use: Yes    Alcohol/week: 3.0 standard drinks of alcohol    Types: 3 Cans of beer per week   Drug use: No   Sexual activity: Never  Other Topics Concern   Not on file  Social History Narrative   Not on file   Social Determinants of Health   Financial Resource Strain: Not on file  Food Insecurity: Not on file  Transportation Needs: Not on file  Physical Activity: Not on file  Stress: Not on file  Social Connections: Not on file  Intimate Partner Violence: Not on file      Review of Systems  Constitutional:  Negative for chills, fatigue and unexpected weight change.  HENT:  Negative for congestion, rhinorrhea, sneezing and sore throat.   Eyes:  Negative for redness.  Respiratory: Negative.  Negative for cough, chest tightness and shortness of breath.   Cardiovascular: Negative.  Negative for chest pain and palpitations.  Gastrointestinal:  Negative for abdominal pain, constipation, diarrhea, nausea and vomiting.  Genitourinary:  Negative for dysuria and frequency.  Musculoskeletal:  Positive for back pain (chronic) and myalgias. Negative for arthralgias, joint swelling and neck pain.  Skin:  Negative for rash.  Neurological: Negative.  Negative for tremors and numbness.  Hematological:  Negative for adenopathy. Does not bruise/bleed easily.  Psychiatric/Behavioral:  Negative for behavioral problems (Depression), sleep disturbance and suicidal ideas. The patient is not  nervous/anxious.     Vital Signs: BP (!) 140/80   Pulse 100   Temp (!) 97.5 F (36.4 C)   Resp 16   Ht 6\' 3"  (1.905 m)   Wt 226 lb 3.2 oz (102.6 kg)   SpO2 98%   BMI 28.27 kg/m    Physical Exam Vitals reviewed.  Constitutional:      General: He is not in acute distress.    Appearance: Normal appearance. He is obese. He is not ill-appearing.  HENT:     Head: Normocephalic and atraumatic.  Eyes:     Pupils: Pupils are equal, round, and reactive to light.  Cardiovascular:     Rate and Rhythm: Normal rate and regular rhythm.  Pulmonary:     Effort: Pulmonary effort is normal. No respiratory distress.  Musculoskeletal:     Lumbar back: Spasms and tenderness present. Decreased range of motion.  Neurological:     Mental Status: He is alert and oriented to person, place, and time.  Psychiatric:  Mood and Affect: Mood normal.        Behavior: Behavior normal.        Assessment/Plan: 1. Chronic bilateral low back pain with bilateral sciatica Lumbar xray ordered for updated imaging. Continue prn percocet and prn cyclobenzaprine as prescribed  - cyclobenzaprine (FLEXERIL) 10 MG tablet; Take one tab po qd as needed for back spasm  Dispense: 45 tablet; Refill: 1 - oxyCODONE-acetaminophen (PERCOCET) 7.5-325 MG tablet; Take 1 tablet by mouth every 6 (six) hours as needed for severe pain.  Dispense: 30 tablet; Refill: 0 - DG Lumbar Spine Complete; Future  2. Seasonal allergies Continue flonase as prescribed.  - fluticasone (FLONASE) 50 MCG/ACT nasal spray; Place 1-2 sprays into both nostrils daily as needed. For allergies.  Dispense: 48 g; Refill: 3  3. Uncontrolled type 2 diabetes mellitus with hyperglycemia (HCC) Continue ozempic as prescribed.  - Semaglutide, 1 MG/DOSE, (OZEMPIC, 1 MG/DOSE,) 4 MG/3ML SOPN; Inject 1 mg into the skin once a week.  Dispense: 9 mL; Refill: 2   General Counseling: Lorenda Cahill understanding of the findings of todays visit and agrees  with plan of treatment. I have discussed any further diagnostic evaluation that may be needed or ordered today. We also reviewed his medications today. he has been encouraged to call the office with any questions or concerns that should arise related to todays visit.    No orders of the defined types were placed in this encounter.   Meds ordered this encounter  Medications   fluticasone (FLONASE) 50 MCG/ACT nasal spray    Sig: Place 1-2 sprays into both nostrils daily as needed. For allergies.    Dispense:  48 g    Refill:  3   cyclobenzaprine (FLEXERIL) 10 MG tablet    Sig: Take one tab po qd as needed for back spasm    Dispense:  45 tablet    Refill:  1    For future refill   oxyCODONE-acetaminophen (PERCOCET) 7.5-325 MG tablet    Sig: Take 1 tablet by mouth every 6 (six) hours as needed for severe pain.    Dispense:  30 tablet    Refill:  0   Semaglutide, 1 MG/DOSE, (OZEMPIC, 1 MG/DOSE,) 4 MG/3ML SOPN    Sig: Inject 1 mg into the skin once a week.    Dispense:  9 mL    Refill:  2    90 days supply  will do ozempic 1 mg weekly while mounjaro is on back order    Return for previously scheduled, F/U, Koby Hartfield PCP in july.   Total time spent:30 Minutes Time spent includes review of chart, medications, test results, and follow up plan with the patient.    Controlled Substance Database was reviewed by me.  This patient was seen by Sallyanne Kuster, FNP-C in collaboration with Dr. Beverely Risen as a part of collaborative care agreement.   Drae Mitzel R. Tedd Sias, MSN, FNP-C Internal medicine

## 2023-01-19 ENCOUNTER — Ambulatory Visit
Admission: RE | Admit: 2023-01-19 | Discharge: 2023-01-19 | Disposition: A | Discharge status: Home / Self Care | Payer: BC Managed Care – PPO | Attending: Nurse Practitioner | Admitting: Nurse Practitioner

## 2023-01-19 ENCOUNTER — Ambulatory Visit
Admission: RE | Admit: 2023-01-19 | Discharge: 2023-01-19 | Disposition: A | Discharge status: Home / Self Care | Payer: BC Managed Care – PPO | Source: Ambulatory Visit | Attending: Nurse Practitioner | Admitting: Nurse Practitioner

## 2023-01-19 ENCOUNTER — Telehealth: Payer: Self-pay

## 2023-01-19 ENCOUNTER — Encounter: Payer: Self-pay | Admitting: Nurse Practitioner

## 2023-01-19 DIAGNOSIS — M5442 Lumbago with sciatica, left side: Secondary | ICD-10-CM | POA: Insufficient documentation

## 2023-01-19 DIAGNOSIS — G8929 Other chronic pain: Secondary | ICD-10-CM | POA: Diagnosis not present

## 2023-01-19 DIAGNOSIS — M5441 Lumbago with sciatica, right side: Secondary | ICD-10-CM | POA: Diagnosis not present

## 2023-01-19 NOTE — Telephone Encounter (Signed)
Spoke with patient, letting him know Alyssa ordered a lumbar spine xray as he hasn't had any imaging of his lower back since 2009.

## 2023-01-22 ENCOUNTER — Telehealth: Payer: Self-pay | Admitting: Nurse Practitioner

## 2023-01-22 DIAGNOSIS — G8929 Other chronic pain: Secondary | ICD-10-CM | POA: Insufficient documentation

## 2023-01-22 NOTE — Telephone Encounter (Signed)
Lvm notifying patient that FMLA completed, faxed, scanned. Patient to return call to see if wants to p/u-Toni

## 2023-01-24 NOTE — Progress Notes (Signed)
Xray confirmed moderate degenerative joint changes and we can provide these results for your intermittent FMLA claim if they request them

## 2023-01-25 ENCOUNTER — Telehealth: Payer: Self-pay | Admitting: Nurse Practitioner

## 2023-01-25 NOTE — Telephone Encounter (Signed)
Work note & FMLA completed. Patient will come by office to p/u at front desk-Toni

## 2023-01-29 ENCOUNTER — Telehealth: Payer: Self-pay | Admitting: Nurse Practitioner

## 2023-01-29 NOTE — Telephone Encounter (Signed)
Received [employer] short term disability form . Gave to Alyssa for signature.

## 2023-01-30 ENCOUNTER — Telehealth: Payer: Self-pay | Admitting: Nurse Practitioner

## 2023-01-30 NOTE — Telephone Encounter (Addendum)
Updated work note with no restrictions emailed to patient. Scanned-Toni

## 2023-01-30 NOTE — Telephone Encounter (Signed)
Received call from [employer]. I verified we received form and was given to AA to complete-Toni

## 2023-01-31 ENCOUNTER — Telehealth: Payer: Self-pay | Admitting: Nurse Practitioner

## 2023-01-31 NOTE — Telephone Encounter (Addendum)
Disability completed, signed, faxed back to [employer]; [phone], emailed to patient. Scanned, at front desk for patient to p/u-Toni

## 2023-02-05 ENCOUNTER — Other Ambulatory Visit: Payer: Self-pay

## 2023-02-05 DIAGNOSIS — I10 Essential (primary) hypertension: Secondary | ICD-10-CM

## 2023-02-05 MED ORDER — AMLODIPINE BESYLATE 10 MG PO TABS
10.0000 mg | ORAL_TABLET | Freq: Every day | ORAL | 3 refills | Status: DC
Start: 2023-02-05 — End: 2024-02-06

## 2023-02-12 ENCOUNTER — Other Ambulatory Visit: Payer: Self-pay

## 2023-02-12 DIAGNOSIS — E1165 Type 2 diabetes mellitus with hyperglycemia: Secondary | ICD-10-CM

## 2023-02-12 MED ORDER — OZEMPIC (1 MG/DOSE) 4 MG/3ML ~~LOC~~ SOPN
1.0000 mg | PEN_INJECTOR | SUBCUTANEOUS | 2 refills | Status: DC
Start: 2023-02-12 — End: 2023-05-09

## 2023-02-14 ENCOUNTER — Ambulatory Visit: Payer: BC Managed Care – PPO | Admitting: Nurse Practitioner

## 2023-02-28 ENCOUNTER — Ambulatory Visit: Payer: BC Managed Care – PPO | Admitting: Nurse Practitioner

## 2023-03-12 ENCOUNTER — Other Ambulatory Visit: Payer: Self-pay | Admitting: Nurse Practitioner

## 2023-03-12 DIAGNOSIS — Z760 Encounter for issue of repeat prescription: Secondary | ICD-10-CM

## 2023-04-16 ENCOUNTER — Other Ambulatory Visit: Payer: Self-pay

## 2023-05-08 ENCOUNTER — Telehealth: Payer: Self-pay

## 2023-05-09 ENCOUNTER — Other Ambulatory Visit: Payer: Self-pay

## 2023-05-09 MED ORDER — TIRZEPATIDE 12.5 MG/0.5ML ~~LOC~~ SOAJ: 12.5000 mg | SUBCUTANEOUS | 2 refills | Status: DC

## 2023-05-09 NOTE — Telephone Encounter (Signed)
Pt advised alyssa sent med for 90

## 2023-05-28 ENCOUNTER — Encounter: Payer: BC Managed Care – PPO | Admitting: Nurse Practitioner

## 2023-05-31 ENCOUNTER — Encounter: Payer: Self-pay | Admitting: Nurse Practitioner

## 2023-05-31 ENCOUNTER — Ambulatory Visit (INDEPENDENT_AMBULATORY_CARE_PROVIDER_SITE_OTHER): Payer: BC Managed Care – PPO | Admitting: Nurse Practitioner

## 2023-05-31 VITALS — BP 120/80 | HR 95 | Temp 98.7°F | Resp 16 | Ht 75.0 in | Wt 232.0 lb

## 2023-05-31 DIAGNOSIS — E1169 Type 2 diabetes mellitus with other specified complication: Secondary | ICD-10-CM

## 2023-05-31 DIAGNOSIS — E1159 Type 2 diabetes mellitus with other circulatory complications: Secondary | ICD-10-CM | POA: Diagnosis not present

## 2023-05-31 DIAGNOSIS — Z1212 Encounter for screening for malignant neoplasm of rectum: Secondary | ICD-10-CM

## 2023-05-31 DIAGNOSIS — Z1211 Encounter for screening for malignant neoplasm of colon: Secondary | ICD-10-CM

## 2023-05-31 DIAGNOSIS — E1165 Type 2 diabetes mellitus with hyperglycemia: Secondary | ICD-10-CM

## 2023-05-31 DIAGNOSIS — G8929 Other chronic pain: Secondary | ICD-10-CM

## 2023-05-31 DIAGNOSIS — I152 Hypertension secondary to endocrine disorders: Secondary | ICD-10-CM

## 2023-05-31 DIAGNOSIS — Z0001 Encounter for general adult medical examination with abnormal findings: Secondary | ICD-10-CM

## 2023-05-31 DIAGNOSIS — M5442 Lumbago with sciatica, left side: Secondary | ICD-10-CM

## 2023-05-31 DIAGNOSIS — M5441 Lumbago with sciatica, right side: Secondary | ICD-10-CM

## 2023-05-31 DIAGNOSIS — E785 Hyperlipidemia, unspecified: Secondary | ICD-10-CM

## 2023-05-31 MED ORDER — TIRZEPATIDE 12.5 MG/0.5ML ~~LOC~~ SOAJ
12.5000 mg | SUBCUTANEOUS | 2 refills | Status: DC
Start: 2023-05-31 — End: 2023-09-06

## 2023-05-31 MED ORDER — OXYCODONE-ACETAMINOPHEN 10-325 MG PO TABS
1.0000 | ORAL_TABLET | Freq: Two times a day (BID) | ORAL | 0 refills | Status: DC | PRN
Start: 2023-05-31 — End: 2023-09-13

## 2023-05-31 NOTE — Progress Notes (Unsigned)
[facility] [address] [place], [place] [address]  Internal MEDICINE  Office Visit Note  Patient Name: Clinton Moore  [id number]  [id number]  Date of Service: 05/31/2023  Chief Complaint  Patient presents with   Depression   Diabetes   Gastroesophageal Reflux   Hypertension   Annual Exam    HPI Clinton Moore presents for an annual well visit and physical exam.  Well-appearing 57 y.o. male with diabetes, hypertension, GERD, and high cholesterol  Routine CRC screening: due for cologuard in december Eye exam and/or foot exam: Labs: due for routine labs  New or worsening pain: none Other concerns:    Current Medication: Outpatient Encounter Medications as of 05/31/2023  Medication Sig   acetaminophen-codeine (TYLENOL/CODEINE #3) 300-30 MG tablet Take 1 tablet by mouth every 4 (four) hours as needed for moderate pain.   amLODipine (NORVASC) 10 MG tablet Take 1 tablet (10 mg total) by mouth daily.   cyclobenzaprine (FLEXERIL) 10 MG tablet Take one tab po qd as needed for back spasm   esomeprazole (NEXIUM) 40 MG capsule TAKE 1 CAPSULE BY MOUTH DAILY   FARXIGA 10 MG TABS tablet TAKE 1 TABLET DAILY BEFORE BREAKFAST   fluticasone (FLONASE) 50 MCG/ACT nasal spray Place 1-2 sprays into both nostrils daily as needed. For allergies.   glimepiride (AMARYL) 4 MG tablet TAKE 1 TABLET DAILY   glucose blood (ACCU-CHEK GUIDE) test strip Use as instructed   hydrochlorothiazide (HYDRODIURIL) 12.5 MG tablet Take 1 tablet (12.5 mg total) by mouth daily.   Lancets (ACCU-CHEK MULTICLIX) lancets Use as instructed   Melatonin-Pyridoxine 5-1 MG TABS Take 1 tablet by mouth at bedtime as needed. For sleep.   meloxicam (MOBIC) 15 MG tablet Take 1 tablet (15 mg total) by mouth daily.   mirtazapine (REMERON) 15 MG tablet TAKE 1 TABLET AT BEDTIME   montelukast (SINGULAIR) 10 MG tablet TAKE 1 TABLET AT BEDTIME   Multiple Vitamin (MULTIVITAMIN WITH MINERALS) TABS tablet Take 1 tablet by  mouth daily. men multivitamin   oxyCODONE-acetaminophen (PERCOCET) 7.5-325 MG tablet Take 1 tablet by mouth every 6 (six) hours as needed for severe pain.   rosuvastatin (CRESTOR) 5 MG tablet Take one tab 2 x a week for high chol   tirzepatide (MOUNJARO) 12.5 MG/0.5ML Pen Inject 12.5 mg into the skin once a week.   traMADol (ULTRAM) 50 MG tablet Take 1 tablet po BID prn pain   No facility-administered encounter medications on file as of 05/31/2023.    Surgical History: Past Surgical History:  Procedure Laterality Date   FOOT MASS EXCISION Right 2007   PLANTAR FASCIA RELEASE Left 06/09/2016   Procedure: Radical excision plantar fibromas of left plantar arch and left hallux;  Surgeon: Linus Galas, DPM;  Location: [facility] ORS;  Service: Podiatry;  Laterality: Left;    Medical History: Past Medical History:  Diagnosis Date   Anxiety    Depression    Diabetes mellitus without complication (HCC)    GERD (gastroesophageal reflux disease)    Headache    history of migraines   Hypertension     Family History: Family History  Problem Relation Age of Onset   Diabetes Father    Hypertension Father     Social History   Socioeconomic History   Marital status: Married    Spouse name: Not on file   Number of children: Not on file   Years of education: Not on file   Highest education level: Not on file  Occupational History  Not on file  Tobacco Use   Smoking status: Some Days    Types: Cigarettes   Smokeless tobacco: Never   Tobacco comments:    social smoker   Substance and Sexual Activity   Alcohol use: Yes    Alcohol/week: 3.0 standard drinks of alcohol    Types: 3 Cans of beer per week   Drug use: No   Sexual activity: Never  Other Topics Concern   Not on file  Social History Narrative   Not on file   Social Determinants of Health   Financial Resource Strain: Not on file  Food Insecurity: Not on file  Transportation Needs: Not on file  Physical Activity: Not on  file  Stress: Not on file  Social Connections: Not on file  Intimate Partner Violence: Not on file      Review of Systems  Constitutional:  Negative for activity change, appetite change, chills, fatigue, fever and unexpected weight change.  HENT: Negative.  Negative for congestion, ear pain, rhinorrhea, sore throat and trouble swallowing.   Eyes: Negative.   Respiratory: Negative.  Negative for cough, chest tightness, shortness of breath and wheezing.   Cardiovascular: Negative.  Negative for chest pain and palpitations.  Gastrointestinal: Negative.  Negative for abdominal pain, blood in stool, constipation, diarrhea, nausea and vomiting.  Endocrine: Negative.   Genitourinary: Negative.  Negative for difficulty urinating, dysuria, frequency, hematuria and urgency.  Musculoskeletal: Negative.  Negative for arthralgias, back pain, joint swelling, myalgias and neck pain.  Skin: Negative.  Negative for rash and wound.  Allergic/Immunologic: Negative.  Negative for immunocompromised state.  Neurological: Negative.  Negative for dizziness, seizures, numbness and headaches.  Hematological: Negative.   Psychiatric/Behavioral: Negative.  Negative for behavioral problems, self-injury and suicidal ideas. The patient is not nervous/anxious.     Vital Signs: BP (!) 124/90   Pulse 95   Temp 98.7 F (37.1 C)   Resp 16   Ht 6\' 3"  (1.905 m)   Wt 232 lb (105.2 kg)   SpO2 96%   BMI 29.00 kg/m    Physical Exam Vitals reviewed.  Constitutional:      General: Clinton Moore is awake. Clinton Moore is not in acute distress.    Appearance: Normal appearance. Clinton Moore is well-developed and well-groomed. Clinton Moore is obese. Clinton Moore is not ill-appearing or diaphoretic.  HENT:     Head: Normocephalic and atraumatic.     Right Ear: Tympanic membrane, ear canal and external ear normal.     Left Ear: Tympanic membrane, ear canal and external ear normal.     Nose: Nose normal. No congestion or rhinorrhea.     Mouth/Throat:     Lips: Pink.      Mouth: Mucous membranes are moist.     Pharynx: Oropharynx is clear. Uvula midline. No oropharyngeal exudate or posterior oropharyngeal erythema.  Eyes:     General: Lids are normal. Vision grossly intact. Gaze aligned appropriately. No scleral icterus.       Right eye: No discharge.        Left eye: No discharge.     Extraocular Movements: Extraocular movements intact.     Conjunctiva/sclera: Conjunctivae normal.     Pupils: Pupils are equal, round, and reactive to light.     Funduscopic exam:    Right eye: Red reflex present.        Left eye: Red reflex present. Neck:     Thyroid: No thyromegaly.     Vascular: No JVD.     Trachea: Trachea  and phonation normal. No tracheal deviation.  Cardiovascular:     Rate and Rhythm: Normal rate and regular rhythm.     Pulses:          Carotid pulses are 3+ on the right side and 3+ on the left side.      Radial pulses are 2+ on the right side and 2+ on the left side.       Dorsalis pedis pulses are 2+ on the right side and 2+ on the left side.       Posterior tibial pulses are 2+ on the right side and 2+ on the left side.     Heart sounds: Normal heart sounds, S1 normal and S2 normal. No murmur heard.    No friction rub. No gallop.  Pulmonary:     Effort: Pulmonary effort is normal. No accessory muscle usage or respiratory distress.     Breath sounds: Normal breath sounds and air entry. No stridor. No wheezing or rales.  Chest:     Chest wall: No tenderness.  Abdominal:     General: Bowel sounds are normal. There is no distension.     Palpations: Abdomen is soft. There is no shifting dullness, fluid wave, mass or pulsatile mass.     Tenderness: There is no abdominal tenderness. There is no guarding or rebound.  Musculoskeletal:        General: No tenderness or deformity. Normal range of motion.     Cervical back: Normal range of motion and neck supple.     Right foot: Normal range of motion. No deformity, bunion, Charcot foot, foot drop  or prominent metatarsal heads.     Left foot: Normal range of motion. No deformity, bunion, Charcot foot, foot drop or prominent metatarsal heads.  Feet:     Right foot:     Protective Sensation: 6 sites tested.  6 sites sensed.     Skin integrity: Dry skin present. No ulcer, blister, skin breakdown, erythema, warmth, callus or fissure.     Toenail Condition: Right toenails are long.     Left foot:     Protective Sensation: 6 sites tested.  6 sites sensed.     Skin integrity: Dry skin present. No ulcer, blister, skin breakdown, erythema, warmth, callus or fissure.     Toenail Condition: Left toenails are long.  Lymphadenopathy:     Cervical: No cervical adenopathy.  Skin:    General: Skin is warm and dry.     Capillary Refill: Capillary refill takes less than 2 seconds.     Coloration: Skin is not pale.     Findings: No erythema or rash.  Neurological:     Mental Status: Clinton Moore is alert and oriented to person, place, and time.     Cranial Nerves: No cranial nerve deficit.     Motor: No abnormal muscle tone.     Coordination: Coordination normal.     Gait: Gait normal.     Deep Tendon Reflexes: Reflexes are normal and symmetric.  Psychiatric:        Mood and Affect: Mood and affect normal.        Speech: Speech normal.        Behavior: Behavior normal. Behavior is cooperative.        Thought Content: Thought content normal.        Judgment: Judgment normal.       Assessment/Plan:     General Counseling: Lorenda Cahill understanding of the findings of todays visit  and agrees with plan of treatment. I have discussed any further diagnostic evaluation that may be needed or ordered today. We also reviewed his medications today. Clinton Moore has been encouraged to call the office with any questions or concerns that should arise related to todays visit.    No orders of the defined types were placed in this encounter.   No orders of the defined types were placed in this  encounter.   No follow-ups on file.   Total time spent:30 Minutes Time spent includes review of chart, medications, test results, and follow up plan with the patient.   [place] Controlled Substance Database was reviewed by me.  This patient was seen by Sallyanne Kuster, FNP-C in collaboration with Dr. Beverely Risen as a part of collaborative care agreement.  Alasia Enge R. Tedd Sias, MSN, FNP-C Internal medicine

## 2023-06-08 ENCOUNTER — Other Ambulatory Visit: Payer: Self-pay | Admitting: Nurse Practitioner

## 2023-06-08 DIAGNOSIS — Z760 Encounter for issue of repeat prescription: Secondary | ICD-10-CM

## 2023-06-11 ENCOUNTER — Ambulatory Visit: Payer: BC Managed Care – PPO | Admitting: Internal Medicine

## 2023-06-11 ENCOUNTER — Encounter: Payer: Self-pay | Admitting: Internal Medicine

## 2023-06-11 VITALS — BP 121/85 | HR 96 | Temp 98.3°F | Resp 16 | Ht 75.0 in | Wt 232.8 lb

## 2023-06-11 DIAGNOSIS — L02419 Cutaneous abscess of limb, unspecified: Secondary | ICD-10-CM | POA: Diagnosis not present

## 2023-06-11 DIAGNOSIS — L732 Hidradenitis suppurativa: Secondary | ICD-10-CM

## 2023-06-11 DIAGNOSIS — E1165 Type 2 diabetes mellitus with hyperglycemia: Secondary | ICD-10-CM

## 2023-06-11 MED ORDER — DOXYCYCLINE HYCLATE 100 MG PO TABS
ORAL_TABLET | ORAL | 1 refills | Status: DC
Start: 2023-06-11 — End: 2023-11-08

## 2023-06-11 NOTE — Progress Notes (Unsigned)
[facility] [address] [place], [place] [address]  Internal MEDICINE  Office Visit Note  Patient Name: Clinton Moore  [id number]  [id number]  Date of Service: 06/11/2023  Chief Complaint  Patient presents with  . Acute Visit  . Recurrent Skin Infections    HPI Pt is c/o multiple lumps in his right armpit There is one open area as well Thinks his diabetes is not too bad but does drink juice on a regular basis  Denies any fever or chills     Current Medication: Outpatient Encounter Medications as of 06/11/2023  Medication Sig  . acetaminophen-codeine (TYLENOL/CODEINE #3) 300-30 MG tablet Take 1 tablet by mouth every 4 (four) hours as needed for moderate pain.  Marland Kitchen amLODipine (NORVASC) 10 MG tablet Take 1 tablet (10 mg total) by mouth daily.  . cyclobenzaprine (FLEXERIL) 10 MG tablet Take one tab po qd as needed for back spasm  . doxycycline (VIBRA-TABS) 100 MG tablet Take one tab po bid with  . esomeprazole (NEXIUM) 40 MG capsule TAKE 1 CAPSULE BY MOUTH DAILY  . FARXIGA 10 MG TABS tablet TAKE 1 TABLET DAILY BEFORE BREAKFAST  . fluticasone (FLONASE) 50 MCG/ACT nasal spray Place 1-2 sprays into both nostrils daily as needed. For allergies.  Marland Kitchen glimepiride (AMARYL) 4 MG tablet TAKE 1 TABLET DAILY  . glucose blood (ACCU-CHEK GUIDE) test strip Use as instructed  . hydrochlorothiazide (HYDRODIURIL) 12.5 MG tablet Take 1 tablet (12.5 mg total) by mouth daily.  . Lancets (ACCU-CHEK MULTICLIX) lancets Use as instructed  . Melatonin-Pyridoxine 5-1 MG TABS Take 1 tablet by mouth at bedtime as needed. For sleep.  . meloxicam (MOBIC) 15 MG tablet Take 1 tablet (15 mg total) by mouth daily.  . mirtazapine (REMERON) 15 MG tablet TAKE 1 TABLET AT BEDTIME  . montelukast (SINGULAIR) 10 MG tablet TAKE 1 TABLET AT BEDTIME  . Multiple Vitamin (MULTIVITAMIN WITH MINERALS) TABS tablet Take 1 tablet by mouth daily. men multivitamin  . oxyCODONE-acetaminophen (PERCOCET) 10-325 MG  tablet Take 1 tablet by mouth 2 (two) times daily as needed for pain.  Marland Kitchen oxyCODONE-acetaminophen (PERCOCET) 7.5-325 MG tablet Take 1 tablet by mouth every 6 (six) hours as needed for severe pain.  . rosuvastatin (CRESTOR) 5 MG tablet Take one tab 2 x a week for high chol  . tirzepatide (MOUNJARO) 12.5 MG/0.5ML Pen Inject 12.5 mg into the skin once a week.  . traMADol (ULTRAM) 50 MG tablet Take 1 tablet po BID prn pain   No facility-administered encounter medications on file as of 06/11/2023.    Surgical History: Past Surgical History:  Procedure Laterality Date  . FOOT MASS EXCISION Right 2007  . PLANTAR FASCIA RELEASE Left 06/09/2016   Procedure: Radical excision plantar fibromas of left plantar arch and left hallux;  Surgeon: Linus Galas, DPM;  Location: [facility] ORS;  Service: Podiatry;  Laterality: Left;    Medical History: Past Medical History:  Diagnosis Date  . Anxiety   . Depression   . Diabetes mellitus without complication (HCC)   . GERD (gastroesophageal reflux disease)   . Headache    history of migraines  . Hypertension     Family History: Family History  Problem Relation Age of Onset  . Diabetes Father   . Hypertension Father     Social History   Socioeconomic History  . Marital status: Married    Spouse name: Not on file  . Number of children: Not on file  . Years of education: Not on  file  . Highest education level: Not on file  Occupational History  . Not on file  Tobacco Use  . Smoking status: Some Days    Types: Cigarettes  . Smokeless tobacco: Never  . Tobacco comments:    social smoker   Substance and Sexual Activity  . Alcohol use: Yes    Alcohol/week: 3.0 standard drinks of alcohol    Types: 3 Cans of beer per week  . Drug use: No  . Sexual activity: Never  Other Topics Concern  . Not on file  Social History Narrative  . Not on file   Social Determinants of Health   Financial Resource Strain: Not on file  Food Insecurity: Not on file   Transportation Needs: Not on file  Physical Activity: Not on file  Stress: Not on file  Social Connections: Not on file  Intimate Partner Violence: Not on file      Review of Systems  Constitutional:  Negative for fatigue and fever.  HENT:  Negative for congestion, mouth sores and postnasal drip.   Respiratory:  Negative for cough.   Cardiovascular:  Negative for chest pain.  Genitourinary:  Negative for flank pain.  Skin:  Positive for wound.  Psychiatric/Behavioral: Negative.      Vital Signs: BP 121/85 Comment: 121/90  Pulse 96   Temp 98.3 F (36.8 C)   Resp 16   Ht 6\' 3"  (1.905 m)   Wt 232 lb 12.8 oz (105.6 kg)   SpO2 98%   BMI 29.10 kg/m    Physical Exam Constitutional:      Appearance: Normal appearance.  HENT:     Head: Normocephalic and atraumatic.     Nose: Nose normal.     Mouth/Throat:     Mouth: Mucous membranes are moist.     Pharynx: No posterior oropharyngeal erythema.  Eyes:     Extraocular Movements: Extraocular movements intact.     Pupils: Pupils are equal, round, and reactive to light.  Cardiovascular:     Pulses: Normal pulses.     Heart sounds: Normal heart sounds.  Pulmonary:     Effort: Pulmonary effort is normal.     Breath sounds: Normal breath sounds.  Skin:    Findings: Lesion present.     Comments: Right axilla has multiple abscesses,, malodorous and firm, tender to palpate   Neurological:     General: No focal deficit present.     Mental Status: He is alert.  Psychiatric:        Mood and Affect: Mood normal.        Behavior: Behavior normal.       Assessment/Plan: 1. Uncontrolled type 2 diabetes mellitus with hyperglycemia (HCC) Stressed on better diet adherence to ADA, this condition will not resolve if glucose is elevated  - Microalbumin / creatinine urine ratio  2. Abscess of axillary region Start on abx and monitor  - doxycycline (VIBRA-TABS) 100 MG tablet; Take one tab po bid with  Dispense: 60 tablet; Refill:  1  3. Hidradenitis Start on chronic suppressive therapy, will taper abx to a low maintenance dose, probably need to be on abx for 6-12 years depending on response, might need surgical exploration  - doxycycline (VIBRA-TABS) 100 MG tablet; Take one tab po bid with  Dispense: 60 tablet; Refill: 1   General Counseling: Lorenda Cahill understanding of the findings of todays visit and agrees with plan of treatment. I have discussed any further diagnostic evaluation that may be needed or ordered  today. We also reviewed his medications today. he has been encouraged to call the office with any questions or concerns that should arise related to todays visit.    Orders Placed This Encounter  Procedures  . Microalbumin / creatinine urine ratio    Meds ordered this encounter  Medications  . doxycycline (VIBRA-TABS) 100 MG tablet    Sig: Take one tab po bid with    Dispense:  60 tablet    Refill:  1    Total time spent:30 Minutes Time spent includes review of chart, medications, test results, and follow up plan with the patient.   [place] Controlled Substance Database was reviewed by me.   Dr Lyndon Code Internal medicine

## 2023-06-12 ENCOUNTER — Telehealth: Payer: Self-pay

## 2023-06-12 LAB — MICROALBUMIN / CREATININE URINE RATIO
Creatinine, Urine: 44.6 mg/dL
Microalb/Creat Ratio: 11 mg/g{creat} (ref 0–29)
Microalbumin, Urine: 5 ug/mL

## 2023-06-12 NOTE — Telephone Encounter (Signed)
Called patient and asked if he could please get labs done before the end of the year, per DFK. His next appointment is in February.

## 2023-07-27 ENCOUNTER — Other Ambulatory Visit: Payer: Self-pay | Admitting: Nurse Practitioner

## 2023-07-27 DIAGNOSIS — E559 Vitamin D deficiency, unspecified: Secondary | ICD-10-CM | POA: Diagnosis not present

## 2023-07-27 DIAGNOSIS — Z125 Encounter for screening for malignant neoplasm of prostate: Secondary | ICD-10-CM | POA: Diagnosis not present

## 2023-07-27 DIAGNOSIS — E538 Deficiency of other specified B group vitamins: Secondary | ICD-10-CM | POA: Diagnosis not present

## 2023-07-27 DIAGNOSIS — E782 Mixed hyperlipidemia: Secondary | ICD-10-CM | POA: Diagnosis not present

## 2023-07-27 DIAGNOSIS — D509 Iron deficiency anemia, unspecified: Secondary | ICD-10-CM | POA: Diagnosis not present

## 2023-07-27 DIAGNOSIS — E1165 Type 2 diabetes mellitus with hyperglycemia: Secondary | ICD-10-CM | POA: Diagnosis not present

## 2023-07-28 LAB — LIPID PANEL
Chol/HDL Ratio: 4.8 {ratio} (ref 0.0–5.0)
Cholesterol, Total: 190 mg/dL (ref 100–199)
HDL: 40 mg/dL (ref >39)
LDL Chol Calc (NIH): 128 mg/dL — ABNORMAL HIGH (ref 0–99)
Triglycerides: 122 mg/dL (ref 0–149)
VLDL Cholesterol Cal: 22 mg/dL (ref 5–40)

## 2023-07-28 LAB — HGB A1C W/O EAG: Hgb A1c MFr Bld: 10.1 % — ABNORMAL HIGH (ref 4.8–5.6)

## 2023-07-28 LAB — CBC WITH DIFFERENTIAL/PLATELET
Basophils Absolute: 0 10*3/uL (ref 0.0–0.2)
Basos: 1 %
EOS (ABSOLUTE): 0.1 10*3/uL (ref 0.0–0.4)
Eos: 1 %
Hematocrit: 49.3 % (ref 37.5–51.0)
Hemoglobin: 15.8 g/dL (ref 13.0–17.7)
Immature Grans (Abs): 0 10*3/uL (ref 0.0–0.1)
Immature Granulocytes: 1 %
Lymphocytes Absolute: 1.4 10*3/uL (ref 0.7–3.1)
Lymphs: 29 %
MCH: 27.6 pg (ref 26.6–33.0)
MCHC: 32 g/dL (ref 31.5–35.7)
MCV: 86 fL (ref 79–97)
Monocytes Absolute: 0.4 10*3/uL (ref 0.1–0.9)
Monocytes: 8 %
Neutrophils Absolute: 2.9 10*3/uL (ref 1.4–7.0)
Neutrophils: 60 %
Platelets: 215 10*3/uL (ref 150–450)
RBC: 5.73 x10E6/uL (ref 4.14–5.80)
RDW: 15.6 % — ABNORMAL HIGH (ref 11.6–15.4)
WBC: 4.9 10*3/uL (ref 3.4–10.8)

## 2023-07-28 LAB — COMPREHENSIVE METABOLIC PANEL
ALT: 60 [IU]/L — ABNORMAL HIGH (ref 0–44)
AST: 34 [IU]/L (ref 0–40)
Albumin: 4.6 g/dL (ref 3.8–4.9)
Alkaline Phosphatase: 125 [IU]/L — ABNORMAL HIGH (ref 44–121)
BUN/Creatinine Ratio: 13 (ref 9–20)
BUN: 12 mg/dL (ref 6–24)
Bilirubin Total: 0.4 mg/dL (ref 0.0–1.2)
CO2: 22 mmol/L (ref 20–29)
Calcium: 9.5 mg/dL (ref 8.7–10.2)
Chloride: 103 mmol/L (ref 96–106)
Creatinine, Ser: 0.96 mg/dL (ref 0.76–1.27)
Globulin, Total: 2.3 g/dL (ref 1.5–4.5)
Glucose: 199 mg/dL — ABNORMAL HIGH (ref 70–99)
Potassium: 4.3 mmol/L (ref 3.5–5.2)
Sodium: 141 mmol/L (ref 134–144)
Total Protein: 6.9 g/dL (ref 6.0–8.5)
eGFR: 93 mL/min/{1.73_m2} (ref >59)

## 2023-07-28 LAB — B12 AND FOLATE PANEL
Folate: 9 ng/mL (ref >3.0)
Vitamin B-12: 741 pg/mL (ref 232–1245)

## 2023-07-28 LAB — VITAMIN D 25 HYDROXY (VIT D DEFICIENCY, FRACTURES): Vit D, 25-Hydroxy: 19.1 ng/mL — ABNORMAL LOW (ref 30.0–100.0)

## 2023-07-28 LAB — PSA: Prostate Specific Ag, Serum: 0.3 ng/mL (ref 0.0–4.0)

## 2023-07-29 ENCOUNTER — Encounter: Payer: Self-pay | Admitting: Nurse Practitioner

## 2023-07-29 NOTE — Addendum Note (Signed)
Addended by: Sallyanne Kuster on: 07/29/2023 10:48 AM   Modules accepted: Level of Service

## 2023-07-30 DIAGNOSIS — Z1212 Encounter for screening for malignant neoplasm of rectum: Secondary | ICD-10-CM | POA: Diagnosis not present

## 2023-07-30 DIAGNOSIS — Z1211 Encounter for screening for malignant neoplasm of colon: Secondary | ICD-10-CM | POA: Diagnosis not present

## 2023-08-07 LAB — COLOGUARD: COLOGUARD: NEGATIVE

## 2023-09-06 ENCOUNTER — Other Ambulatory Visit: Payer: Self-pay

## 2023-09-06 DIAGNOSIS — E1169 Type 2 diabetes mellitus with other specified complication: Secondary | ICD-10-CM

## 2023-09-06 MED ORDER — TIRZEPATIDE 12.5 MG/0.5ML ~~LOC~~ SOAJ: 12.5000 mg | SUBCUTANEOUS | 2 refills | Status: DC

## 2023-09-13 ENCOUNTER — Encounter: Payer: Self-pay | Admitting: Nurse Practitioner

## 2023-09-13 ENCOUNTER — Ambulatory Visit (INDEPENDENT_AMBULATORY_CARE_PROVIDER_SITE_OTHER): Payer: BC Managed Care – PPO | Admitting: Nurse Practitioner

## 2023-09-13 VITALS — BP 130/84 | HR 100 | Temp 98.1°F | Resp 16 | Ht 75.0 in | Wt 229.6 lb

## 2023-09-13 DIAGNOSIS — J302 Other seasonal allergic rhinitis: Secondary | ICD-10-CM

## 2023-09-13 DIAGNOSIS — E1169 Type 2 diabetes mellitus with other specified complication: Secondary | ICD-10-CM | POA: Diagnosis not present

## 2023-09-13 DIAGNOSIS — G8929 Other chronic pain: Secondary | ICD-10-CM

## 2023-09-13 DIAGNOSIS — M5441 Lumbago with sciatica, right side: Secondary | ICD-10-CM

## 2023-09-13 DIAGNOSIS — M5442 Lumbago with sciatica, left side: Secondary | ICD-10-CM | POA: Diagnosis not present

## 2023-09-13 DIAGNOSIS — Z760 Encounter for issue of repeat prescription: Secondary | ICD-10-CM

## 2023-09-13 DIAGNOSIS — Z794 Long term (current) use of insulin: Secondary | ICD-10-CM | POA: Diagnosis not present

## 2023-09-13 DIAGNOSIS — E785 Hyperlipidemia, unspecified: Secondary | ICD-10-CM

## 2023-09-13 MED ORDER — ROSUVASTATIN CALCIUM 10 MG PO TABS: ORAL_TABLET | ORAL | 3 refills | Status: DC

## 2023-09-13 MED ORDER — DAPAGLIFLOZIN PROPANEDIOL 10 MG PO TABS
10.0000 mg | ORAL_TABLET | Freq: Every day | ORAL | 3 refills | Status: DC
Start: 2023-09-13 — End: 2024-03-03

## 2023-09-13 MED ORDER — OXYCODONE-ACETAMINOPHEN 10-325 MG PO TABS: 1.0000 | ORAL_TABLET | Freq: Two times a day (BID) | ORAL | 0 refills | Status: DC | PRN

## 2023-09-13 MED ORDER — GLIMEPIRIDE 4 MG PO TABS: ORAL_TABLET | ORAL | 3 refills | Status: DC

## 2023-09-13 MED ORDER — MONTELUKAST SODIUM 10 MG PO TABS: 10.0000 mg | ORAL_TABLET | Freq: Every day | ORAL | 3 refills | Status: AC

## 2023-09-13 NOTE — Progress Notes (Signed)
 [facility] [address] [place], [place] [address]  Internal MEDICINE  Office Visit Note  Patient Name: Clinton Moore  [id number]  [id number]  Date of Service: 09/13/2023  Chief Complaint  Patient presents with   Depression   Diabetes   Gastroesophageal Reflux   Hypertension    HPI Clinton Moore presents for a follow-up visit for diabetes, hypertension, high cholesterol, and chronic back pain.  Diabetes -- most recent A1c was elevated at 10.1. too early to recheck A1c today. Still having issues with drinking too much juice which is running his glucose level up. Taking mounjaro, farxiga, and glimepride.  Hypertension -- controlled on amlodipine High cholesterol -- taking rosuvastatin, no issues  Chronic back pain -- takes percocet as needed.     Current Medication: Outpatient Encounter Medications as of 09/13/2023  Medication Sig   acetaminophen-codeine (TYLENOL/CODEINE #3) 300-30 MG tablet Take 1 tablet by mouth every 4 (four) hours as needed for moderate pain.   amLODipine (NORVASC) 10 MG tablet Take 1 tablet (10 mg total) by mouth daily.   cyclobenzaprine (FLEXERIL) 10 MG tablet Take one tab po qd as needed for back spasm   doxycycline (VIBRA-TABS) 100 MG tablet Take one tab po bid with   esomeprazole (NEXIUM) 40 MG capsule TAKE 1 CAPSULE BY MOUTH DAILY   fluticasone (FLONASE) 50 MCG/ACT nasal spray Place 1-2 sprays into both nostrils daily as needed. For allergies.   glucose blood (ACCU-CHEK GUIDE) test strip Use as instructed   Lancets (ACCU-CHEK MULTICLIX) lancets Use as instructed   Melatonin-Pyridoxine 5-1 MG TABS Take 1 tablet by mouth at bedtime as needed. For sleep.   Multiple Vitamin (MULTIVITAMIN WITH MINERALS) TABS tablet Take 1 tablet by mouth daily. men multivitamin   rosuvastatin (CRESTOR) 10 MG tablet Take 1 tablet by mouth twice weekly for high cholesterol   tirzepatide (MOUNJARO) 12.5 MG/0.5ML Pen Inject 12.5 mg into the skin once a week.    traMADol (ULTRAM) 50 MG tablet Take 1 tablet po BID prn pain   [DISCONTINUED] FARXIGA 10 MG TABS tablet TAKE 1 TABLET DAILY BEFORE BREAKFAST   [DISCONTINUED] glimepiride (AMARYL) 4 MG tablet TAKE 1 TABLET DAILY   [DISCONTINUED] hydrochlorothiazide (HYDRODIURIL) 12.5 MG tablet Take 1 tablet (12.5 mg total) by mouth daily.   [DISCONTINUED] meloxicam (MOBIC) 15 MG tablet Take 1 tablet (15 mg total) by mouth daily.   [DISCONTINUED] mirtazapine (REMERON) 15 MG tablet TAKE 1 TABLET AT BEDTIME   [DISCONTINUED] montelukast (SINGULAIR) 10 MG tablet TAKE 1 TABLET AT BEDTIME   [DISCONTINUED] oxyCODONE-acetaminophen (PERCOCET) 10-325 MG tablet Take 1 tablet by mouth 2 (two) times daily as needed for pain.   [DISCONTINUED] oxyCODONE-acetaminophen (PERCOCET) 7.5-325 MG tablet Take 1 tablet by mouth every 6 (six) hours as needed for severe pain.   [DISCONTINUED] rosuvastatin (CRESTOR) 5 MG tablet Take one tab 2 x a week for high chol   dapagliflozin propanediol (FARXIGA) 10 MG TABS tablet Take 1 tablet (10 mg total) by mouth daily before breakfast.   glimepiride (AMARYL) 4 MG tablet TAKE 1 TABLET DAILY   montelukast (SINGULAIR) 10 MG tablet Take 1 tablet (10 mg total) by mouth at bedtime.   oxyCODONE-acetaminophen (PERCOCET) 10-325 MG tablet Take 1 tablet by mouth 2 (two) times daily as needed for pain.   No facility-administered encounter medications on file as of 09/13/2023.    Surgical History: Past Surgical History:  Procedure Laterality Date   FOOT MASS EXCISION Right 2007   PLANTAR FASCIA RELEASE Left 06/09/2016  Procedure: Radical excision plantar fibromas of left plantar arch and left hallux;  Surgeon: Linus Galas, DPM;  Location: [facility] ORS;  Service: Podiatry;  Laterality: Left;    Medical History: Past Medical History:  Diagnosis Date   Anxiety    Depression    Diabetes mellitus without complication (HCC)    GERD (gastroesophageal reflux disease)    Headache    history of migraines    Hypertension     Family History: Family History  Problem Relation Age of Onset   Diabetes Father    Hypertension Father     Social History   Socioeconomic History   Marital status: Married    Spouse name: Not on file   Number of children: Not on file   Years of education: Not on file   Highest education level: Not on file  Occupational History   Not on file  Tobacco Use   Smoking status: Some Days    Types: Cigarettes   Smokeless tobacco: Never   Tobacco comments:    social smoker   Substance and Sexual Activity   Alcohol use: Yes    Alcohol/week: 3.0 standard drinks of alcohol    Types: 3 Cans of beer per week   Drug use: No   Sexual activity: Never  Other Topics Concern   Not on file  Social History Narrative   Not on file   Social Drivers of Health   Financial Resource Strain: Not on file  Food Insecurity: Not on file  Transportation Needs: Not on file  Physical Activity: Not on file  Stress: Not on file  Social Connections: Not on file  Intimate Partner Violence: Not on file      Review of Systems  Constitutional:  Negative for chills, fatigue and unexpected weight change.  HENT:  Negative for congestion, rhinorrhea, sneezing and sore throat.   Eyes:  Negative for redness.  Respiratory: Negative.  Negative for cough, chest tightness and shortness of breath.   Cardiovascular: Negative.  Negative for chest pain and palpitations.  Gastrointestinal:  Negative for abdominal pain, constipation, diarrhea, nausea and vomiting.  Genitourinary:  Negative for dysuria and frequency.  Musculoskeletal:  Positive for back pain (chronic) and myalgias. Negative for arthralgias, joint swelling and neck pain.  Skin:  Negative for rash.  Neurological: Negative.  Negative for tremors and numbness.  Hematological:  Negative for adenopathy. Does not bruise/bleed easily.  Psychiatric/Behavioral:  Negative for behavioral problems (Depression), sleep disturbance and suicidal  ideas. The patient is not nervous/anxious.     Vital Signs: BP 130/84   Pulse 100   Temp 98.1 F (36.7 C)   Resp 16   Ht 6\' 3"  (1.905 m)   Wt 229 lb 9.6 oz (104.1 kg)   SpO2 98%   BMI 28.70 kg/m    Physical Exam Vitals reviewed.  Constitutional:      General: He is not in acute distress.    Appearance: Normal appearance. He is obese. He is not ill-appearing.  HENT:     Head: Normocephalic and atraumatic.  Eyes:     Pupils: Pupils are equal, round, and reactive to light.  Cardiovascular:     Rate and Rhythm: Normal rate and regular rhythm.  Pulmonary:     Effort: Pulmonary effort is normal. No respiratory distress.  Musculoskeletal:     Lumbar back: Spasms and tenderness present. Decreased range of motion.  Neurological:     Mental Status: He is alert and oriented to person, place, and time.  Psychiatric:        Mood and Affect: Mood normal.        Behavior: Behavior normal.        Assessment/Plan: 1. Type 2 diabetes mellitus with other specified complication, with long-term current use of insulin (HCC) (Primary) Continue mounjaro, farxiga, and glimepiride as prescribed.  - dapagliflozin propanediol (FARXIGA) 10 MG TABS tablet; Take 1 tablet (10 mg total) by mouth daily before breakfast.  Dispense: 90 tablet; Refill: 3 - glimepiride (AMARYL) 4 MG tablet; TAKE 1 TABLET DAILY  Dispense: 90 tablet; Refill: 3  2. Hyperlipidemia associated with type 2 diabetes mellitus (HCC) Continue rosuvastatin as prescribed  - rosuvastatin (CRESTOR) 10 MG tablet; Take 1 tablet by mouth twice weekly for high cholesterol  Dispense: 36 tablet; Refill: 3  3. Chronic bilateral low back pain with bilateral sciatica Continue prn percocet as prescribed.  - oxyCODONE-acetaminophen (PERCOCET) 10-325 MG tablet; Take 1 tablet by mouth 2 (two) times daily as needed for pain.  Dispense: 60 tablet; Refill: 0  4. Seasonal allergies Continue montelukast as prescribed.  - montelukast (SINGULAIR)  10 MG tablet; Take 1 tablet (10 mg total) by mouth at bedtime.  Dispense: 90 tablet; Refill: 3   General Counseling: Lorenda Cahill understanding of the findings of todays visit and agrees with plan of treatment. I have discussed any further diagnostic evaluation that may be needed or ordered today. We also reviewed his medications today. he has been encouraged to call the office with any questions or concerns that should arise related to todays visit.    No orders of the defined types were placed in this encounter.   Meds ordered this encounter  Medications   dapagliflozin propanediol (FARXIGA) 10 MG TABS tablet    Sig: Take 1 tablet (10 mg total) by mouth daily before breakfast.    Dispense:  90 tablet    Refill:  3   glimepiride (AMARYL) 4 MG tablet    Sig: TAKE 1 TABLET DAILY    Dispense:  90 tablet    Refill:  3   montelukast (SINGULAIR) 10 MG tablet    Sig: Take 1 tablet (10 mg total) by mouth at bedtime.    Dispense:  90 tablet    Refill:  3   oxyCODONE-acetaminophen (PERCOCET) 10-325 MG tablet    Sig: Take 1 tablet by mouth 2 (two) times daily as needed for pain.    Dispense:  60 tablet    Refill:  0    Refill for today   rosuvastatin (CRESTOR) 10 MG tablet    Sig: Take 1 tablet by mouth twice weekly for high cholesterol    Dispense:  36 tablet    Refill:  3    Fill new script today. Note increased dose, discontinue 5 mg dose.    Return in about 3 months (around 12/11/2023) for F/U, Recheck A1C, Christophe Rising PCP.   Total time spent:30 Minutes Time spent includes review of chart, medications, test results, and follow up plan with the patient.   [place] Controlled Substance Database was reviewed by me.  This patient was seen by Sallyanne Kuster, FNP-C in collaboration with Dr. Beverely Risen as a part of collaborative care agreement.   Antara Brecheisen R. Tedd Sias, MSN, FNP-C Internal medicine

## 2023-09-17 ENCOUNTER — Telehealth: Payer: Self-pay

## 2023-09-17 DIAGNOSIS — Z79899 Other long term (current) drug therapy: Secondary | ICD-10-CM

## 2023-09-17 NOTE — Progress Notes (Signed)
   09/17/2023  Patient ID: Clinton Moore, male   DOB: 1966/07/26, 58 y.o.   MRN: [id number]  Attempted to contact patient for medication management/review. Left HIPAA compliant message for patient to return my call at their convenience.   -First attempt for patient outreach. Will follow up with patient in 7-10 business days..  Thank you for allowing pharmacy to be a part of this patient's care.  Cephus Shelling, PharmD Clinical Pharmacist Cell: [phone]

## 2023-10-21 ENCOUNTER — Encounter: Payer: Self-pay | Admitting: Nurse Practitioner

## 2023-11-08 ENCOUNTER — Other Ambulatory Visit: Payer: Self-pay

## 2023-11-08 DIAGNOSIS — E785 Hyperlipidemia, unspecified: Secondary | ICD-10-CM

## 2023-11-08 DIAGNOSIS — E1159 Type 2 diabetes mellitus with other circulatory complications: Secondary | ICD-10-CM

## 2023-11-08 NOTE — Patient Instructions (Addendum)
 Mr. Balfour,   Check your blood pressure once daily (on Sundays), and any time you have concerning symptoms like headache, chest pain, dizziness, shortness of breath, or vision changes.   Our goal is less than 130/80.  To appropriately check your blood pressure, make sure you do the following:  1) Avoid caffeine, exercise, or tobacco products for 30 minutes before checking. Empty your bladder. 2) Sit with your back supported in a flat-backed chair. Rest your arm on something flat (arm of the chair, table, etc). 3) Sit still with your feet flat on the floor, resting, for at least 5 minutes.  4) Check your blood pressure. Take 1-2 readings.  5) Write down these readings and bring with you to any provider appointments.  Bring your home blood pressure machine with you to a provider's office for accuracy comparison at least once a year.   Make sure you take your blood pressure medications before you come to any office visit, even if you were asked to fast for labs.  Check your blood sugars twice daily:  1) Fasting, first thing in the morning before breakfast and  2) 2 hours after your largest meal alternating checking days for lunch and dinner.   For a goal A1c of less than 7%, goal fasting readings are less than 130 and goal 2 hour after meal readings are less than 180.    Date Fasting  Blood Sugar  Lunch* Blood Sugar  Dinner* Blood Sugar Blood Pressure Heart Rate                        *Alternate checking the 2-hour after blood sugar readings each day   Thank you for allowing pharmacy to be a part of this patient's care. Cephus Shelling, PharmD Clinical Pharmacist Cell: [phone]

## 2023-11-08 NOTE — Progress Notes (Signed)
 11/08/2023 Name: Clinton Moore MRN: [id number] DOB: Jan 16, 1966  No chief complaint on file.   Clinton Moore is a 58 y.o. year old male who presented for a telephone visit.   They were referred to the pharmacist by a quality report for assistance in managing diabetes.    Subjective:  Care Team: Primary Care Provider: Sallyanne Kuster, NP ; Next Scheduled Visit: 12/13/2023   Medication Access/Adherence  Current Pharmacy:  [id number] [place] Rough, [place] - [address] [place] [address] Phone: [phone] Fax: [phone]  [id number] [place] D[place], [place] - [address] AT [facility] [address] [place] [address] Phone: [phone] Fax: [phone]   Patient reports affordability concerns with their medications: No  [employer] commercial. Mounjaro $55-75 sometimes then it increases $125 Patient reports access/transportation concerns to their pharmacy: No  Patient reports adherence concerns with their medications:  No  Phone alerts to help remind him to take his medication    Diabetes:  Current medications: Farxiga 10 mg daily, Mounjaro 12.5 mg weekly, glimepiride 4 mg daily   Current glucose readings: Has not checked BG for the past month due to misplaced BGM. Patient is agreeable to a replacement.   Patient denies hypoglycemic s/sx including dizziness, shakiness, sweating. Patient reports hyperglycemic symptoms including polyuria, polydipsia, nocturia.  Current meal patterns:  - Breakfast: sausage biscuits, grits, oatmeal, egg biscuits - Lunch: frozen dinner, sandwich  - Supper: meat protein, veggies, starch (rice, potatoes, occasionally pasta) but not every dinner  - Snacks: nuts, chips, cheese puffs, tuna packets, vienna sausage  - Drinks: (all regular; occasionally drinks diet beverages): EtOH (beer), water, tea, soda, Juice  Current physical activity: walking (daily, 30-60  min); basketball with kids/grandchildren  Current medication access support: N/A  Hypertension:  Current medications: amlodipine 10 mg daily   Patient has a validated, automated, upper arm home BP cuff Current blood pressure readings readings: when needed about once monthly  Patient denies hypotensive s/sx including dizziness, lightheadedness.  Patient denies hypertensive symptoms including headache, chest pain, shortness of breath    Hyperlipidemia/ASCVD Risk Reduction  Current lipid lowering medications: rosuvastatin 10 mg twice weekly   The 10-year ASCVD risk score (Arnett DK, et al., 2019) is: 35.3%   Values used to calculate the score:     Age: 58 years     Sex: Male     Is Non-Hispanic African American: Yes     Diabetic: Yes     Tobacco smoker: Yes     Systolic Blood Pressure: 130 mmHg     Is BP treated: Yes     HDL Cholesterol: 40 mg/dL     Total Cholesterol: 190 mg/dL    Objective:  Lab Results  Component Value Date   HGBA1C 10.1 (H) 07/27/2023    Lab Results  Component Value Date   CREATININE 0.96 07/27/2023   BUN 12 07/27/2023   NA 141 07/27/2023   K 4.3 07/27/2023   CL 103 07/27/2023   CO2 22 07/27/2023    Lab Results  Component Value Date   CHOL 190 07/27/2023   HDL 40 07/27/2023   LDLCALC 128 (H) 07/27/2023   TRIG 122 07/27/2023   CHOLHDL 4.8 07/27/2023    Medications Reviewed Today     Reviewed by Katha Cabal, RPH (Pharmacist) on 11/08/23 at 1033  Med List Status: <None>   Medication Order Taking? Sig Documenting  Provider Last Dose Status Informant  amLODipine (NORVASC) 10 MG tablet [id number] Yes Take 1 tablet (10 mg total) by mouth daily. Sallyanne Kuster, NP Taking Active   cyclobenzaprine (FLEXERIL) 10 MG tablet [id number] Yes Take one tab po qd as needed for back spasm Sallyanne Kuster, NP Taking Active            Med Note Para March, Stiven Kaspar R   Thu Nov 08, 2023 10:29 AM) Patient  would like this medication to be changed. He  tried another medication and it works better.   dapagliflozin propanediol (FARXIGA) 10 MG TABS tablet [id number] Yes Take 1 tablet (10 mg total) by mouth daily before breakfast. Sallyanne Kuster, NP Taking Active   esomeprazole (NEXIUM) 40 MG capsule [id number] Yes TAKE 1 CAPSULE BY MOUTH DAILY Abernathy, Alyssa, NP Taking Active   fluticasone (FLONASE) 50 MCG/ACT nasal spray [id number] Yes Place 1-2 sprays into both nostrils daily as needed. For allergies. Sallyanne Kuster, NP Taking Active   glimepiride (AMARYL) 4 MG tablet [id number] Yes TAKE 1 TABLET DAILY Abernathy, Alyssa, NP Taking Active   glucose blood (ACCU-CHEK GUIDE) test strip [id number] No Use as instructed  Patient not taking: Reported on 11/08/2023   Johnna Acosta, NP Not Taking Active   Lancets (ACCU-CHEK MULTICLIX) lancets [id number] No Use as instructed  Patient not taking: Reported on 11/08/2023   Johnna Acosta, NP Not Taking Active   Melatonin-Pyridoxine 5-1 MG TABS [id number] Yes Take 1 tablet by mouth at bedtime as needed. For sleep. [provider] Taking Active Self           Med Note Para March, [facility] R   Thu Nov 08, 2023 10:31 AM) Taking PRN  mirtazapine (REMERON) 15 MG tablet [id number] Yes Take 15 mg by mouth at bedtime. [provider] Taking Active   montelukast (SINGULAIR) 10 MG tablet [id number] Yes Take 1 tablet (10 mg total) by mouth at bedtime. Sallyanne Kuster, NP Taking Active   Multiple Vitamin (MULTIVITAMIN WITH MINERALS) TABS tablet [id number] Yes Take 1 tablet by mouth daily. men multivitamin [provider] Taking Active Self  oxyCODONE-acetaminophen (PERCOCET) 10-325 MG tablet [id number] Yes Take 1 tablet by mouth 2 (two) times daily as needed for pain. Sallyanne Kuster, NP Taking Active   rosuvastatin (CRESTOR) 10 MG tablet [id number] Yes Take 1 tablet by mouth twice weekly for high cholesterol Abernathy, Alyssa, NP Taking Active   tirzepatide [facility]) 12.5 MG/0.5ML Pen [id number]  Yes Inject 12.5 mg into the skin once a week. Sallyanne Kuster, NP Taking Active            Med Note Para March, [facility] R   Thu Nov 08, 2023 10:33 AM) Sundays   traMADol (ULTRAM) 50 MG tablet [id number] Yes Take 1 tablet po BID prn pain Sallyanne Kuster, NP Taking Active               Assessment/Plan:   Diabetes: - Diabetes currently uncontrolled, likely due to dietary habits (e.g., juice consumption and breakfast choices). [facility] with the patient on the line to verify the cost of Mounjaro; quoted price was $25 for an 84-day supply. However, this price was not associated with a savings card on file. Although we completed a Mounjaro savings card application, it appears the patient was already receiving the medication at the lowest available price. - Reviewed long term cardiovascular and renal outcomes of uncontrolled blood sugar - Reviewed goal A1c, goal fasting, and goal 2 hour post prandial glucose - Reviewed dietary modifications: continue water  enhancers, dilute juice (1:1 ratio with water), and use 'open-face biscuit' method - Reviewed lifestyle modifications including: continue 150 min/week of moderate exercise such as daily walks  - Recommend to continue current medication regimen.   - Recommend to check glucose twice daily fasting and post prandial (alternating lunch and dinner). Patient misplaced his BGM and will need another meter. Will collaborate with PCP to obtain a new BGM and diabetic supplies. - Defer lab for hemoglobin A1c for PCP at the next appointment      Hypertension: - Currently controlled per last office visit reading. Per fill history, patient adherence rate is lower. We discussed using a pillbox and patient already has one at home that he plans to use.  - Reviewed long term cardiovascular and renal outcomes of uncontrolled blood pressure - Reviewed appropriate blood pressure monitoring technique and reviewed goal blood pressure. Recommended to  check home blood pressure and heart rate once weekly on the day he uses Mounjaro.  - Recommend to continue current regimen.       Hyperlipidemia/ASCVD Risk Reduction: - Currently uncontrolled. Patient reports taking rosuvastatin once or twice weekly, without a consistent schedule. Discussed the use of a pill box; patient has one at home but currently does not use it. - Reviewed long term complications of uncontrolled cholesterol - Recommend to continue current regimen. Future consideration to increase dosing frequency.      Miscellaneous:  - Patient does not need any prescription renewals for maintenance medications related to cardiometabolic disease state management. Amlodipine prescription expires July 2025. -Patient no longer takes cyclobenzaprine and reports trying another medication that works better. He would like to switch to this new medication. Medication management will be deferred to the PCP for further assessment.    Follow Up Plan: Next Scheduled PCP Visit: 12/13/2023; then Pharmacist Telephone in 2 weeks after PCP appointment  Time spent 52: 08 min  Cephus Shelling, PharmD Clinical Pharmacist Cell: [phone]

## 2023-11-14 ENCOUNTER — Other Ambulatory Visit: Payer: Self-pay

## 2023-11-14 DIAGNOSIS — E1165 Type 2 diabetes mellitus with hyperglycemia: Secondary | ICD-10-CM

## 2023-11-14 MED ORDER — ACCU-CHEK SOFTCLIX LANCETS MISC: 1 refills | Status: AC

## 2023-11-14 MED ORDER — ACCU-CHEK GUIDE ME W/DEVICE KIT: PACK | 0 refills | Status: AC

## 2023-11-14 MED ORDER — GLUCOSE BLOOD VI STRP: ORAL_STRIP | 12 refills | Status: AC

## 2023-11-29 ENCOUNTER — Other Ambulatory Visit: Payer: Self-pay | Admitting: Nurse Practitioner

## 2023-11-29 DIAGNOSIS — G8929 Other chronic pain: Secondary | ICD-10-CM

## 2023-12-10 ENCOUNTER — Other Ambulatory Visit: Payer: Self-pay | Admitting: Nurse Practitioner

## 2023-12-10 DIAGNOSIS — Z760 Encounter for issue of repeat prescription: Secondary | ICD-10-CM

## 2023-12-13 ENCOUNTER — Ambulatory Visit (INDEPENDENT_AMBULATORY_CARE_PROVIDER_SITE_OTHER): Payer: BC Managed Care – PPO | Admitting: Nurse Practitioner

## 2023-12-13 ENCOUNTER — Encounter: Payer: Self-pay | Admitting: Nurse Practitioner

## 2023-12-13 VITALS — BP 135/88 | HR 90 | Temp 97.8°F | Resp 16 | Ht 75.0 in | Wt 223.6 lb

## 2023-12-13 DIAGNOSIS — E1169 Type 2 diabetes mellitus with other specified complication: Secondary | ICD-10-CM

## 2023-12-13 DIAGNOSIS — E1159 Type 2 diabetes mellitus with other circulatory complications: Secondary | ICD-10-CM | POA: Diagnosis not present

## 2023-12-13 DIAGNOSIS — M5442 Lumbago with sciatica, left side: Secondary | ICD-10-CM | POA: Diagnosis not present

## 2023-12-13 DIAGNOSIS — E785 Hyperlipidemia, unspecified: Secondary | ICD-10-CM

## 2023-12-13 DIAGNOSIS — Z794 Long term (current) use of insulin: Secondary | ICD-10-CM

## 2023-12-13 DIAGNOSIS — G8929 Other chronic pain: Secondary | ICD-10-CM

## 2023-12-13 DIAGNOSIS — M5441 Lumbago with sciatica, right side: Secondary | ICD-10-CM

## 2023-12-13 DIAGNOSIS — I152 Hypertension secondary to endocrine disorders: Secondary | ICD-10-CM

## 2023-12-13 LAB — POCT GLYCOSYLATED HEMOGLOBIN (HGB A1C): Hemoglobin A1C: 10.3 % — AB (ref 4.0–5.6)

## 2023-12-13 MED ORDER — OXYCODONE-ACETAMINOPHEN 10-325 MG PO TABS: 1.0000 | ORAL_TABLET | Freq: Two times a day (BID) | ORAL | 0 refills | Status: DC | PRN

## 2023-12-13 MED ORDER — DEXCOM G7 SENSOR MISC
11 refills | Status: AC
Start: 2023-12-13 — End: ?

## 2023-12-13 MED ORDER — TOUJEO MAX SOLOSTAR 300 UNIT/ML ~~LOC~~ SOPN
10.0000 [IU] | PEN_INJECTOR | Freq: Every day | SUBCUTANEOUS | 3 refills | Status: DC
Start: 2023-12-13 — End: 2024-04-23

## 2023-12-13 MED ORDER — INSULIN PEN NEEDLE 31G X 5 MM MISC
3 refills | Status: DC
Start: 2023-12-13 — End: 2024-04-23

## 2023-12-13 NOTE — Progress Notes (Signed)
 [facility] [address] [place], [place] [address]  Internal MEDICINE  Office Visit Note  Patient Name: Clinton Moore  [id number]  [id number]  Date of Service: 12/13/2023  Chief Complaint  Patient presents with   Depression   Diabetes   Gastroesophageal Reflux   Hypertension   Follow-up    HPI Clinton Moore presents for a follow-up visit for diabetes, hypertension, high cholesterol, and chronic low back pain.  Diabetes -- A1c remains significantly elevated at 10.3.  He has not been adhering to a diabetic diet and has been taking glimepiride , farxiga , and mounjaro . We have not started insulin  yet but discussed with patient that this is the next step. He is agreeable to this.  Hypertension --controlled with amlodipine  High cholesterol -- currently taking rosuvastatin  Chronic low back pain -- takes percocet as needed     Current Medication: Outpatient Encounter Medications as of 12/13/2023  Medication Sig Note   Accu-Chek Softclix Lancets lancets Use as instructed once a daily DX E11.65    amLODipine  (NORVASC ) 10 MG tablet Take 1 tablet (10 mg total) by mouth daily.    Blood Glucose Monitoring Suppl (ACCU-CHEK GUIDE ME) w/Device KIT Use as directed to check glucose DX E11.65    Continuous Glucose Sensor (DEXCOM G7 SENSOR) MISC Apply 1 sensor every 10 days to the skin for CGM for type 2 diabetes E11.65    cyclobenzaprine  (FLEXERIL ) 10 MG tablet TAKE 1 TABLET DAILY AS NEEDED FOR BACK SPASM    dapagliflozin  propanediol (FARXIGA ) 10 MG TABS tablet Take 1 tablet (10 mg total) by mouth daily before breakfast.    esomeprazole  (NEXIUM ) 40 MG capsule TAKE 1 CAPSULE BY MOUTH DAILY    fluticasone  (FLONASE ) 50 MCG/ACT nasal spray Place 1-2 sprays into both nostrils daily as needed. For allergies.    glimepiride  (AMARYL ) 4 MG tablet TAKE 1 TABLET DAILY    glucose blood (ACCU-CHEK GUIDE) test strip Use as instructed once daily DX e11.65    insulin  glargine, 2 Unit Dial, (TOUJEO   MAX SOLOSTAR) 300 UNIT/ML Solostar Pen Inject 10 Units into the skin at bedtime.    Insulin  Pen Needle 31G X 5 MM MISC Use 1 pen needle to administer insulin  daily. For diabetes E11.65    Melatonin-Pyridoxine 5-1 MG TABS Take 1 tablet by mouth at bedtime as needed. For sleep. 11/08/2023: Taking PRN   mirtazapine  (REMERON ) 15 MG tablet Take 15 mg by mouth at bedtime.    montelukast  (SINGULAIR ) 10 MG tablet Take 1 tablet (10 mg total) by mouth at bedtime.    Multiple Vitamin (MULTIVITAMIN WITH MINERALS) TABS tablet Take 1 tablet by mouth daily. men multivitamin    rosuvastatin  (CRESTOR ) 10 MG tablet Take 1 tablet by mouth twice weekly for high cholesterol    tirzepatide  (MOUNJARO ) 12.5 MG/0.5ML Pen Inject 12.5 mg into the skin once a week. 11/08/2023: Sundays    traMADol  (ULTRAM ) 50 MG tablet Take 1 tablet po BID prn pain    [DISCONTINUED] oxyCODONE -acetaminophen  (PERCOCET) 10-325 MG tablet Take 1 tablet by mouth 2 (two) times daily as needed for pain.    oxyCODONE -acetaminophen  (PERCOCET) 10-325 MG tablet Take 1 tablet by mouth 2 (two) times daily as needed for pain.    No facility-administered encounter medications on file as of 12/13/2023.    Surgical History: Past Surgical History:  Procedure Laterality Date   FOOT MASS EXCISION Right 2007   PLANTAR FASCIA RELEASE Left 06/09/2016   Procedure: Radical excision plantar fibromas of left plantar arch and left hallux;  Surgeon: Krystal Rosella, DPM;  Location: [facility] ORS;  Service: Podiatry;  Laterality: Left;    Medical History: Past Medical History:  Diagnosis Date   Anxiety    Depression    Diabetes mellitus without complication (HCC)    GERD (gastroesophageal reflux disease)    Headache    history of migraines   Hypertension     Family History: Family History  Problem Relation Age of Onset   Diabetes Father    Hypertension Father     Social History   Socioeconomic History   Marital status: Married    Spouse name: Not on file    Number of children: Not on file   Years of education: Not on file   Highest education level: Not on file  Occupational History   Not on file  Tobacco Use   Smoking status: Some Days    Types: Cigarettes   Smokeless tobacco: Never   Tobacco comments:    social smoker   Substance and Sexual Activity   Alcohol use: Yes    Alcohol/week: 3.0 standard drinks of alcohol    Types: 3 Cans of beer per week   Drug use: No   Sexual activity: Never  Other Topics Concern   Not on file  Social History Narrative   Not on file   Social Drivers of Health   Financial Resource Strain: Not on file  Food Insecurity: Not on file  Transportation Needs: Not on file  Physical Activity: Not on file  Stress: Not on file  Social Connections: Not on file  Intimate Partner Violence: Not on file      Review of Systems  Constitutional:  Negative for chills, fatigue and unexpected weight change.  HENT:  Negative for congestion, rhinorrhea, sneezing and sore throat.   Eyes:  Negative for redness.  Respiratory: Negative.  Negative for cough, chest tightness and shortness of breath.   Cardiovascular: Negative.  Negative for chest pain and palpitations.  Gastrointestinal:  Negative for abdominal pain, constipation, diarrhea, nausea and vomiting.  Genitourinary:  Negative for dysuria and frequency.  Musculoskeletal:  Positive for back pain (chronic) and myalgias. Negative for arthralgias, joint swelling and neck pain.  Skin:  Negative for rash.  Neurological: Negative.  Negative for tremors and numbness.  Hematological:  Negative for adenopathy. Does not bruise/bleed easily.  Psychiatric/Behavioral:  Negative for behavioral problems (Depression), sleep disturbance and suicidal ideas. The patient is not nervous/anxious.     Vital Signs: BP 135/88 Comment: 148/96  Pulse 90   Temp 97.8 F (36.6 C)   Resp 16   Ht 6' 3 (1.905 m)   Wt 223 lb 9.6 oz (101.4 kg)   SpO2 98%   BMI 27.95 kg/m     Physical Exam Vitals reviewed.  Constitutional:      General: He is not in acute distress.    Appearance: Normal appearance. He is obese. He is not ill-appearing.  HENT:     Head: Normocephalic and atraumatic.   Eyes:     Pupils: Pupils are equal, round, and reactive to light.    Cardiovascular:     Rate and Rhythm: Normal rate and regular rhythm.  Pulmonary:     Effort: Pulmonary effort is normal. No respiratory distress.   Musculoskeletal:     Lumbar back: Spasms and tenderness present. Decreased range of motion.   Neurological:     Mental Status: He is alert and oriented to person, place, and time.   Psychiatric:  Mood and Affect: Mood normal.        Behavior: Behavior normal.        Assessment/Plan: 1. Type 2 diabetes mellitus with other specified complication, with long-term current use of insulin  (HCC) (Primary) A1c is significantly elevated at 10.3. continue glimepiride , farxiga  and mounjaro . Start toujeo  insulin  10 units daily. Dexcom CGM sensors prescribed as well.  - POCT glycosylated hemoglobin (Hb A1C) - insulin  glargine, 2 Unit Dial, (TOUJEO  MAX SOLOSTAR) 300 UNIT/ML Solostar Pen; Inject 10 Units into the skin at bedtime.  Dispense: 3 mL; Refill: 3 - Continuous Glucose Sensor (DEXCOM G7 SENSOR) MISC; Apply 1 sensor every 10 days to the skin for CGM for type 2 diabetes E11.65  Dispense: 3 each; Refill: 11 - Insulin  Pen Needle 31G X 5 MM MISC; Use 1 pen needle to administer insulin  daily. For diabetes E11.65  Dispense: 90 each; Refill: 3  2. Hypertension associated with type 2 diabetes mellitus (HCC) Stable, continue amlodipine  as prescribed.   3. Hyperlipidemia associated with type 2 diabetes mellitus (HCC) Continue rosuvastatin  as prescribed   4. Chronic bilateral low back pain with bilateral sciatica Continue percocet as needed.  - oxyCODONE -acetaminophen  (PERCOCET) 10-325 MG tablet; Take 1 tablet by mouth 2 (two) times daily as needed for  pain.  Dispense: 60 tablet; Refill: 0   General Counseling: Elsie oakland understanding of the findings of todays visit and agrees with plan of treatment. I have discussed any further diagnostic evaluation that may be needed or ordered today. We also reviewed his medications today. he has been encouraged to call the office with any questions or concerns that should arise related to todays visit.    Orders Placed This Encounter  Procedures   POCT glycosylated hemoglobin (Hb A1C)    Meds ordered this encounter  Medications   insulin  glargine, 2 Unit Dial, (TOUJEO  MAX SOLOSTAR) 300 UNIT/ML Solostar Pen    Sig: Inject 10 Units into the skin at bedtime.    Dispense:  3 mL    Refill:  3    Fill new script today, dx code E11.65   Continuous Glucose Sensor (DEXCOM G7 SENSOR) MISC    Sig: Apply 1 sensor every 10 days to the skin for CGM for type 2 diabetes E11.65    Dispense:  3 each    Refill:  11    Patient is now starting insulin    Insulin  Pen Needle 31G X 5 MM MISC    Sig: Use 1 pen needle to administer insulin  daily. For diabetes E11.65    Dispense:  90 each    Refill:  3   oxyCODONE -acetaminophen  (PERCOCET) 10-325 MG tablet    Sig: Take 1 tablet by mouth 2 (two) times daily as needed for pain.    Dispense:  60 tablet    Refill:  0    Refill for today    Return in about 1 month (around 01/13/2024) for F/U, eval new med, [place] Going PCP.   Total time spent:30 Minutes Time spent includes review of chart, medications, test results, and follow up plan with the patient.   [place] Controlled Substance Database was reviewed by me.  This patient was seen by Mardy Maxin, FNP-C in collaboration with Dr. Sigrid Bathe as a part of collaborative care agreement.   Poetry Cerro R. Maxin, MSN, FNP-C Internal medicine

## 2024-01-01 ENCOUNTER — Telehealth: Payer: Self-pay

## 2024-01-01 NOTE — Progress Notes (Signed)
   01/01/2024  Patient ID: Clinton Moore, male   DOB: February 20, 1966, 58 y.o.   MRN: [id number]  Attempted to contact patient for medication management related to Diabetic True [facility] Metric Report. Left HIPAA compliant message for patient to return my call at their convenience. Reviewed chart for new medications. Patient recently started Toujeo  and Dexcom. A1c slightly worsened. LDL above goal on rosuvastatin  10 mg daily; may consider future assessment for dose increase vs medication adherence. Will follow up on BG while on insulin  and cost. Reviewed adherence rate in Dr. Anson Basta. Follow up after PCP appointment.   Alexandria Angel, PharmD Clinical Pharmacist Cell: [phone]

## 2024-01-13 ENCOUNTER — Encounter: Payer: Self-pay | Admitting: Nurse Practitioner

## 2024-01-17 ENCOUNTER — Encounter: Payer: Self-pay | Admitting: Nurse Practitioner

## 2024-01-17 ENCOUNTER — Ambulatory Visit: Admitting: Nurse Practitioner

## 2024-01-17 VITALS — BP 134/74 | HR 95 | Temp 98.2°F | Resp 16 | Ht 75.0 in | Wt 222.8 lb

## 2024-01-17 DIAGNOSIS — F330 Major depressive disorder, recurrent, mild: Secondary | ICD-10-CM | POA: Diagnosis not present

## 2024-01-17 DIAGNOSIS — I152 Hypertension secondary to endocrine disorders: Secondary | ICD-10-CM

## 2024-01-17 DIAGNOSIS — Z794 Long term (current) use of insulin: Secondary | ICD-10-CM

## 2024-01-17 DIAGNOSIS — B079 Viral wart, unspecified: Secondary | ICD-10-CM

## 2024-01-17 DIAGNOSIS — E1159 Type 2 diabetes mellitus with other circulatory complications: Secondary | ICD-10-CM | POA: Diagnosis not present

## 2024-01-17 DIAGNOSIS — E1169 Type 2 diabetes mellitus with other specified complication: Secondary | ICD-10-CM

## 2024-01-17 DIAGNOSIS — E785 Hyperlipidemia, unspecified: Secondary | ICD-10-CM

## 2024-01-17 NOTE — Progress Notes (Signed)
 [facility] [address] [place], [place] [address]  Internal MEDICINE  Office Visit Note  Patient Name: Clinton Moore  [id number]  [id number]  Date of Service: 01/17/2024  Chief Complaint  Patient presents with   Depression   Diabetes   Gastroesophageal Reflux   Hypertension   Follow-up    HPI Tyray presents for a follow-up visit for Diabetes -- started basal insulin  at 10 units daily, highest glucose per patient has been 130s.  Warts on hands -- declined dermatology referral for now  Hypertension -- controlled with amlodipine  High cholesterol -- taking rosuvastatin  Taking mirtazapine  for depression -- probably causing weight gain or making it difficult to lose weight.    Current Medication: Outpatient Encounter Medications as of 01/17/2024  Medication Sig Note   Accu-Chek Softclix Lancets lancets Use as instructed once a daily DX E11.65    amLODipine  (NORVASC ) 10 MG tablet Take 1 tablet (10 mg total) by mouth daily.    Blood Glucose Monitoring Suppl (ACCU-CHEK GUIDE ME) w/Device KIT Use as directed to check glucose DX E11.65    Continuous Glucose Sensor (DEXCOM G7 SENSOR) MISC Apply 1 sensor every 10 days to the skin for CGM for type 2 diabetes E11.65    cyclobenzaprine  (FLEXERIL ) 10 MG tablet TAKE 1 TABLET DAILY AS NEEDED FOR BACK SPASM    dapagliflozin  propanediol (FARXIGA ) 10 MG TABS tablet Take 1 tablet (10 mg total) by mouth daily before breakfast.    esomeprazole  (NEXIUM ) 40 MG capsule TAKE 1 CAPSULE BY MOUTH DAILY    fluticasone  (FLONASE ) 50 MCG/ACT nasal spray Place 1-2 sprays into both nostrils daily as needed. For allergies.    glimepiride  (AMARYL ) 4 MG tablet TAKE 1 TABLET DAILY    glucose blood (ACCU-CHEK GUIDE) test strip Use as instructed once daily DX e11.65    insulin  glargine, 2 Unit Dial, (TOUJEO  MAX SOLOSTAR) 300 UNIT/ML Solostar Pen Inject 10 Units into the skin at bedtime.    Insulin  Pen Needle 31G X 5 MM MISC Use 1 pen needle to  administer insulin  daily. For diabetes E11.65    Melatonin-Pyridoxine 5-1 MG TABS Take 1 tablet by mouth at bedtime as needed. For sleep. 11/08/2023: Taking PRN   mirtazapine  (REMERON ) 15 MG tablet Take 15 mg by mouth at bedtime.    montelukast  (SINGULAIR ) 10 MG tablet Take 1 tablet (10 mg total) by mouth at bedtime.    Multiple Vitamin (MULTIVITAMIN WITH MINERALS) TABS tablet Take 1 tablet by mouth daily. men multivitamin    oxyCODONE -acetaminophen  (PERCOCET) 10-325 MG tablet Take 1 tablet by mouth 2 (two) times daily as needed for pain.    rosuvastatin  (CRESTOR ) 10 MG tablet Take 1 tablet by mouth twice weekly for high cholesterol    tirzepatide  (MOUNJARO ) 12.5 MG/0.5ML Pen Inject 12.5 mg into the skin once a week. 11/08/2023: Sundays    traMADol  (ULTRAM ) 50 MG tablet Take 1 tablet po BID prn pain    No facility-administered encounter medications on file as of 01/17/2024.    Surgical History: Past Surgical History:  Procedure Laterality Date   FOOT MASS EXCISION Right 2007   PLANTAR FASCIA RELEASE Left 06/09/2016   Procedure: Radical excision plantar fibromas of left plantar arch and left hallux;  Surgeon: Krystal Rosella, DPM;  Location: [facility] ORS;  Service: Podiatry;  Laterality: Left;    Medical History: Past Medical History:  Diagnosis Date   Anxiety    Depression    Diabetes mellitus without complication (HCC)    GERD (gastroesophageal reflux  disease)    Headache    history of migraines   Hypertension     Family History: Family History  Problem Relation Age of Onset   Diabetes Father    Hypertension Father     Social History   Socioeconomic History   Marital status: Married    Spouse name: Not on file   Number of children: Not on file   Years of education: Not on file   Highest education level: Not on file  Occupational History   Not on file  Tobacco Use   Smoking status: Some Days    Types: Cigarettes   Smokeless tobacco: Never   Tobacco comments:    social  smoker   Substance and Sexual Activity   Alcohol use: Yes    Alcohol/week: 3.0 standard drinks of alcohol    Types: 3 Cans of beer per week   Drug use: No   Sexual activity: Never  Other Topics Concern   Not on file  Social History Narrative   Not on file   Social Drivers of Health   Financial Resource Strain: Not on file  Food Insecurity: Not on file  Transportation Needs: Not on file  Physical Activity: Not on file  Stress: Not on file  Social Connections: Not on file  Intimate Partner Violence: Not on file      Review of Systems  Constitutional:  Negative for chills, fatigue and unexpected weight change.  HENT:  Negative for congestion, rhinorrhea, sneezing and sore throat.   Eyes:  Negative for redness.  Respiratory: Negative.  Negative for cough, chest tightness and shortness of breath.   Cardiovascular: Negative.  Negative for chest pain and palpitations.  Gastrointestinal:  Negative for abdominal pain, constipation, diarrhea, nausea and vomiting.  Genitourinary:  Negative for dysuria and frequency.  Musculoskeletal:  Positive for back pain (chronic) and myalgias. Negative for arthralgias, joint swelling and neck pain.  Skin:  Negative for rash.  Neurological: Negative.  Negative for tremors and numbness.  Hematological:  Negative for adenopathy. Does not bruise/bleed easily.  Psychiatric/Behavioral:  Negative for behavioral problems (Depression), sleep disturbance and suicidal ideas. The patient is not nervous/anxious.     Vital Signs: BP 134/74   Pulse 95   Temp 98.2 F (36.8 C)   Resp 16   Ht 6' 3 (1.905 m)   Wt 222 lb 12.8 oz (101.1 kg)   SpO2 99%   BMI 27.85 kg/m    Physical Exam Vitals reviewed.  Constitutional:      General: He is not in acute distress.    Appearance: Normal appearance. He is obese. He is not ill-appearing.  HENT:     Head: Normocephalic and atraumatic.  Eyes:     Pupils: Pupils are equal, round, and reactive to light.   Cardiovascular:     Rate and Rhythm: Normal rate and regular rhythm.  Pulmonary:     Effort: Pulmonary effort is normal. No respiratory distress.  Musculoskeletal:     Lumbar back: Spasms and tenderness present. Decreased range of motion.  Neurological:     Mental Status: He is alert and oriented to person, place, and time.  Psychiatric:        Mood and Affect: Mood normal.        Behavior: Behavior normal.        Assessment/Plan: 1. Type 2 diabetes mellitus with other specified complication, with long-term current use of insulin  (HCC) (Primary) Continue toujeo , mounjaro , farxiga  and glimepiride  as prescribed.  2. Hypertension associated with type 2 diabetes mellitus (HCC) Stable, continue amlodipine  as prescribed.   3. Hyperlipidemia associated with type 2 diabetes mellitus (HCC) Continue rosuvastatin  as prescribed.   4. Wart of hand Declined referral to dermatologist for now, readdress at future visit.   5. Mild episode of recurrent major depressive disorder (HCC) Taking mirtazapine  currently, consider switching this medication to a different one at his next office visit due to side effect of weight gain and increased appetite.    General Counseling: alby schwabe understanding of the findings of todays visit and agrees with plan of treatment. I have discussed any further diagnostic evaluation that may be needed or ordered today. We also reviewed his medications today. he has been encouraged to call the office with any questions or concerns that should arise related to todays visit.    No orders of the defined types were placed in this encounter.   No orders of the defined types were placed in this encounter.   Return in about 3 months (around 04/18/2024) for F/U, Recheck A1C, Gwendlyon Zumbro PCP.   Total time spent:30 Minutes Time spent includes review of chart, medications, test results, and follow up plan with the patient.   [place] Controlled Substance Database was  reviewed by me.  This patient was seen by Mardy Maxin, FNP-C in collaboration with Dr. Sigrid Bathe as a part of collaborative care agreement.   Kay Shippy R. Maxin, MSN, FNP-C Internal medicine

## 2024-02-06 ENCOUNTER — Other Ambulatory Visit: Payer: Self-pay | Admitting: Nurse Practitioner

## 2024-02-06 DIAGNOSIS — I10 Essential (primary) hypertension: Secondary | ICD-10-CM

## 2024-02-22 ENCOUNTER — Encounter: Payer: Self-pay | Admitting: Nurse Practitioner

## 2024-03-03 ENCOUNTER — Telehealth: Payer: Self-pay

## 2024-03-03 ENCOUNTER — Other Ambulatory Visit: Payer: Self-pay | Admitting: Nurse Practitioner

## 2024-03-03 DIAGNOSIS — E1169 Type 2 diabetes mellitus with other specified complication: Secondary | ICD-10-CM

## 2024-03-03 NOTE — Progress Notes (Signed)
   03/03/2024  Patient ID: Clinton Moore, male   DOB: [birth year], 58 y.o.   MRN: [id number]  Attempted to contact patient for medication management/review. Left HIPAA compliant message for patient to return my call at their convenience.  Will follow up with patient in 1-2 weeks.  Thank you for allowing pharmacy to be a part of this patient's care.  Dorcas Solian, PharmD Clinical Pharmacist Cell: [phone]

## 2024-03-06 ENCOUNTER — Other Ambulatory Visit: Payer: Self-pay | Admitting: Nurse Practitioner

## 2024-03-10 ENCOUNTER — Telehealth: Payer: Self-pay

## 2024-03-10 NOTE — Progress Notes (Signed)
   03/10/2024  Patient ID: Clinton Moore, male   DOB: 08/12/1965, 58 y.o.   MRN: [id number]  Attempted to contact patient for medication management/review. Left HIPAA compliant message for patient to return my call at their convenience.  Will follow up with patient in 2-4 weeks.  Upcoming appointment with PCP on 04/22/2024.  Thank you for allowing pharmacy to be a part of this patient's care.  Dorcas Solian, PharmD Clinical Pharmacist Cell: [phone]

## 2024-03-16 ENCOUNTER — Other Ambulatory Visit: Payer: Self-pay | Admitting: Nurse Practitioner

## 2024-03-16 DIAGNOSIS — E1169 Type 2 diabetes mellitus with other specified complication: Secondary | ICD-10-CM

## 2024-04-21 ENCOUNTER — Telehealth: Payer: Self-pay | Admitting: Nurse Practitioner

## 2024-04-21 NOTE — Telephone Encounter (Signed)
 Lvm & sent mychart msg to move 04/22/24 appointment to -Andree

## 2024-04-22 ENCOUNTER — Ambulatory Visit: Admitting: Nurse Practitioner

## 2024-04-23 ENCOUNTER — Ambulatory Visit (INDEPENDENT_AMBULATORY_CARE_PROVIDER_SITE_OTHER): Admitting: Nurse Practitioner

## 2024-04-23 ENCOUNTER — Encounter: Payer: Self-pay | Admitting: Nurse Practitioner

## 2024-04-23 VITALS — BP 130/88 | HR 100 | Temp 97.4°F | Resp 16 | Ht 75.0 in | Wt 218.4 lb

## 2024-04-23 DIAGNOSIS — E1159 Type 2 diabetes mellitus with other circulatory complications: Secondary | ICD-10-CM | POA: Diagnosis not present

## 2024-04-23 DIAGNOSIS — M5442 Lumbago with sciatica, left side: Secondary | ICD-10-CM | POA: Diagnosis not present

## 2024-04-23 DIAGNOSIS — E1169 Type 2 diabetes mellitus with other specified complication: Secondary | ICD-10-CM | POA: Diagnosis not present

## 2024-04-23 DIAGNOSIS — E785 Hyperlipidemia, unspecified: Secondary | ICD-10-CM

## 2024-04-23 DIAGNOSIS — K219 Gastro-esophageal reflux disease without esophagitis: Secondary | ICD-10-CM

## 2024-04-23 DIAGNOSIS — G8929 Other chronic pain: Secondary | ICD-10-CM

## 2024-04-23 DIAGNOSIS — Z794 Long term (current) use of insulin: Secondary | ICD-10-CM | POA: Diagnosis not present

## 2024-04-23 DIAGNOSIS — I152 Hypertension secondary to endocrine disorders: Secondary | ICD-10-CM

## 2024-04-23 DIAGNOSIS — M5441 Lumbago with sciatica, right side: Secondary | ICD-10-CM

## 2024-04-23 DIAGNOSIS — Z760 Encounter for issue of repeat prescription: Secondary | ICD-10-CM

## 2024-04-23 LAB — POCT GLYCOSYLATED HEMOGLOBIN (HGB A1C): Hemoglobin A1C: 7.6 % — AB (ref 4.0–5.6)

## 2024-04-23 MED ORDER — TIRZEPATIDE 12.5 MG/0.5ML ~~LOC~~ SOAJ: 12.5000 mg | SUBCUTANEOUS | 2 refills | Status: DC

## 2024-04-23 MED ORDER — BACLOFEN 20 MG PO TABS: 20.0000 mg | ORAL_TABLET | Freq: Three times a day (TID) | ORAL | 1 refills | Status: DC | PRN

## 2024-04-23 MED ORDER — ESOMEPRAZOLE MAGNESIUM 40 MG PO CPDR: 40.0000 mg | DELAYED_RELEASE_CAPSULE | Freq: Every day | ORAL | 1 refills | Status: AC

## 2024-04-23 MED ORDER — OXYCODONE-ACETAMINOPHEN 10-325 MG PO TABS: 1.0000 | ORAL_TABLET | Freq: Two times a day (BID) | ORAL | 0 refills | Status: DC | PRN

## 2024-04-23 MED ORDER — ROSUVASTATIN CALCIUM 10 MG PO TABS: ORAL_TABLET | ORAL | 3 refills | Status: AC

## 2024-04-23 MED ORDER — TIRZEPATIDE 12.5 MG/0.5ML ~~LOC~~ SOAJ: 12.5000 mg | SUBCUTANEOUS | 2 refills | Status: AC

## 2024-04-23 NOTE — Progress Notes (Signed)
 [facility] [address] [place], [place] [address]  Internal MEDICINE  Office Visit Note  Patient Name: Clinton Moore  [id number]  [id number]  Date of Service: 04/23/2024  Chief Complaint  Patient presents with   Depression   Diabetes   Gastroesophageal Reflux   Hypertension   Follow-up    HPI Clinton Moore presents for a follow-up visit for diabetes, hypertension, high cholesterol, chronic low back pain and GERD.  Diabetes -- A1c is significantly improved to 7.6. He is currently taking mounjaro  and farxiga .  Hypertension -- controlled with amlodipine   High cholesterol --taking rosuvastatin  daily  Chronic low back pain -- takes oxycodone  as needed and  GERD -- controlled on esomeprazole .    Current Medication: Outpatient Encounter Medications as of 04/23/2024  Medication Sig Note   baclofen  (LIORESAL ) 20 MG tablet Take 1 tablet (20 mg total) by mouth 3 (three) times daily as needed for muscle spasms.    Accu-Chek Softclix Lancets lancets Use as instructed once a daily DX E11.65    amLODipine  (NORVASC ) 10 MG tablet TAKE 1 TABLET DAILY    Blood Glucose Monitoring Suppl (ACCU-CHEK GUIDE ME) w/Device KIT Use as directed to check glucose DX E11.65    Continuous Glucose Sensor (DEXCOM G7 SENSOR) MISC Apply 1 sensor every 10 days to the skin for CGM for type 2 diabetes E11.65    esomeprazole  (NEXIUM ) 40 MG capsule Take 1 capsule (40 mg total) by mouth daily.    FARXIGA  10 MG TABS tablet TAKE 1 TABLET DAILY BEFORE BREAKFAST    fluticasone  (FLONASE ) 50 MCG/ACT nasal spray Place 1-2 sprays into both nostrils daily as needed. For allergies.    glimepiride  (AMARYL ) 4 MG tablet TAKE 1 TABLET DAILY    glucose blood (ACCU-CHEK GUIDE) test strip Use as instructed once daily DX e11.65    Melatonin-Pyridoxine 5-1 MG TABS Take 1 tablet by mouth at bedtime as needed. For sleep. 11/08/2023: Taking PRN   montelukast  (SINGULAIR ) 10 MG tablet Take 1 tablet (10 mg total) by mouth at  bedtime.    Multiple Vitamin (MULTIVITAMIN WITH MINERALS) TABS tablet Take 1 tablet by mouth daily. men multivitamin    oxyCODONE -acetaminophen  (PERCOCET) 10-325 MG tablet Take 1 tablet by mouth 2 (two) times daily as needed for pain.    rosuvastatin  (CRESTOR ) 10 MG tablet Take 1 tablet by mouth twice weekly for high cholesterol    tirzepatide  (MOUNJARO ) 12.5 MG/0.5ML Pen Inject 12.5 mg into the skin once a week.    [DISCONTINUED] cyclobenzaprine  (FLEXERIL ) 10 MG tablet TAKE 1 TABLET DAILY AS NEEDED FOR BACK SPASM    [DISCONTINUED] esomeprazole  (NEXIUM ) 40 MG capsule TAKE 1 CAPSULE BY MOUTH DAILY    [DISCONTINUED] insulin  glargine, 2 Unit Dial, (TOUJEO  MAX SOLOSTAR) 300 UNIT/ML Solostar Pen Inject 10 Units into the skin at bedtime.    [DISCONTINUED] Insulin  Pen Needle 31G X 5 MM MISC Use 1 pen needle to administer insulin  daily. For diabetes E11.65    [DISCONTINUED] mirtazapine  (REMERON ) 15 MG tablet TAKE 1 TABLET AT BEDTIME    [DISCONTINUED] oxyCODONE -acetaminophen  (PERCOCET) 10-325 MG tablet Take 1 tablet by mouth 2 (two) times daily as needed for pain.    [DISCONTINUED] rosuvastatin  (CRESTOR ) 10 MG tablet Take 1 tablet by mouth twice weekly for high cholesterol    [DISCONTINUED] tirzepatide  (MOUNJARO ) 12.5 MG/0.5ML Pen Inject 12.5 mg into the skin once a week. 11/08/2023: Sundays    [DISCONTINUED] tirzepatide  (MOUNJARO ) 12.5 MG/0.5ML Pen Inject 12.5 mg into the skin once a week.    [  DISCONTINUED] traMADol  (ULTRAM ) 50 MG tablet Take 1 tablet po BID prn pain    No facility-administered encounter medications on file as of 04/23/2024.    Surgical History: Past Surgical History:  Procedure Laterality Date   FOOT MASS EXCISION Right 2007   PLANTAR FASCIA RELEASE Left 06/09/2016   Procedure: Radical excision plantar fibromas of left plantar arch and left hallux;  Surgeon: Krystal Rosella, DPM;  Location: [facility] ORS;  Service: Podiatry;  Laterality: Left;    Medical History: Past Medical History:   Diagnosis Date   Anxiety    Depression    Diabetes mellitus without complication (HCC)    GERD (gastroesophageal reflux disease)    Headache    history of migraines   Hypertension     Family History: Family History  Problem Relation Age of Onset   Diabetes Father    Hypertension Father     Social History   Socioeconomic History   Marital status: Married    Spouse name: Not on file   Number of children: Not on file   Years of education: Not on file   Highest education level: Not on file  Occupational History   Not on file  Tobacco Use   Smoking status: Some Days    Types: Cigarettes   Smokeless tobacco: Never   Tobacco comments:    social smoker   Substance and Sexual Activity   Alcohol use: Yes    Alcohol/week: 3.0 standard drinks of alcohol    Types: 3 Cans of beer per week   Drug use: No   Sexual activity: Never  Other Topics Concern   Not on file  Social History Narrative   Not on file   Social Drivers of Health   Financial Resource Strain: Not on file  Food Insecurity: Not on file  Transportation Needs: Not on file  Physical Activity: Not on file  Stress: Not on file  Social Connections: Not on file  Intimate Partner Violence: Not on file      Review of Systems  Constitutional:  Negative for chills, fatigue and unexpected weight change.  HENT:  Negative for congestion, rhinorrhea, sneezing and sore throat.   Eyes:  Negative for redness.  Respiratory: Negative.  Negative for cough, chest tightness and shortness of breath.   Cardiovascular: Negative.  Negative for chest pain and palpitations.  Gastrointestinal:  Negative for abdominal pain, constipation, diarrhea, nausea and vomiting.  Genitourinary:  Negative for dysuria and frequency.  Musculoskeletal:  Positive for back pain (chronic) and myalgias. Negative for arthralgias, joint swelling and neck pain.  Skin:  Negative for rash.  Neurological: Negative.  Negative for tremors and numbness.   Hematological:  Negative for adenopathy. Does not bruise/bleed easily.  Psychiatric/Behavioral:  Negative for behavioral problems (Depression), sleep disturbance and suicidal ideas. The patient is not nervous/anxious.     Vital Signs: BP 130/88   Pulse 100   Temp (!) 97.4 F (36.3 C)   Resp 16   Ht 6' 3 (1.905 m)   Wt 218 lb 6.4 oz (99.1 kg)   SpO2 99%   BMI 27.30 kg/m    Physical Exam Vitals reviewed.  Constitutional:      General: He is not in acute distress.    Appearance: Normal appearance. He is obese. He is not ill-appearing.  HENT:     Head: Normocephalic and atraumatic.  Eyes:     Pupils: Pupils are equal, round, and reactive to light.  Cardiovascular:     Rate and  Rhythm: Normal rate and regular rhythm.  Pulmonary:     Effort: Pulmonary effort is normal. No respiratory distress.  Musculoskeletal:     Lumbar back: Spasms and tenderness present. Decreased range of motion.  Neurological:     Mental Status: He is alert and oriented to person, place, and time.  Psychiatric:        Mood and Affect: Mood normal.        Behavior: Behavior normal.        Assessment/Plan: 1. Type 2 diabetes mellitus with other specified complication, with long-term current use of insulin  (HCC) (Primary) A1c is significantly improved to 7.6 today from 10.3 in may. Urine sent for microalbumin/creatinine ratio. Continue mounjaro  as prescribed.  - POCT glycosylated hemoglobin (Hb A1C) - Urine Microalbumin w/creat. ratio - tirzepatide  (MOUNJARO ) 12.5 MG/0.5ML Pen; Inject 12.5 mg into the skin once a week.  Dispense: 6 mL; Refill: 2  2. Hypertension associated with type 2 diabetes mellitus (HCC) Continue amlodipine  as prescribed.   3. Hyperlipidemia associated with type 2 diabetes mellitus (HCC) Continue rosuvastatin  as prescribed.  - rosuvastatin  (CRESTOR ) 10 MG tablet; Take 1 tablet by mouth twice weekly for high cholesterol  Dispense: 36 tablet; Refill: 3  4. Gastroesophageal  reflux disease without esophagitis Continue esomeprazole  as prescribed.  - esomeprazole  (NEXIUM ) 40 MG capsule; Take 1 capsule (40 mg total) by mouth daily.  Dispense: 90 capsule; Refill: 1  5. Chronic bilateral low back pain with bilateral sciatica Continue prn percocet as prescribed. Also may take baclofen  as needed for musculoskeletal pain.  - baclofen  (LIORESAL ) 20 MG tablet; Take 1 tablet (20 mg total) by mouth 3 (three) times daily as needed for muscle spasms.  Dispense: 90 each; Refill: 1 - oxyCODONE -acetaminophen  (PERCOCET) 10-325 MG tablet; Take 1 tablet by mouth 2 (two) times daily as needed for pain.  Dispense: 60 tablet; Refill: 0   General Counseling: Elsie oakland understanding of the findings of todays visit and agrees with plan of treatment. I have discussed any further diagnostic evaluation that may be needed or ordered today. We also reviewed his medications today. he has been encouraged to call the office with any questions or concerns that should arise related to todays visit.    Orders Placed This Encounter  Procedures   Urine Microalbumin w/creat. ratio   POCT glycosylated hemoglobin (Hb A1C)    Meds ordered this encounter  Medications   baclofen  (LIORESAL ) 20 MG tablet    Sig: Take 1 tablet (20 mg total) by mouth 3 (three) times daily as needed for muscle spasms.    Dispense:  90 each    Refill:  1    Fill new script today. Discontinue cyclobenzaprine    esomeprazole  (NEXIUM ) 40 MG capsule    Sig: Take 1 capsule (40 mg total) by mouth daily.    Dispense:  90 capsule    Refill:  1   rosuvastatin  (CRESTOR ) 10 MG tablet    Sig: Take 1 tablet by mouth twice weekly for high cholesterol    Dispense:  36 tablet    Refill:  3    Fill new script today. Note increased dose, discontinue 5 mg dose.   DISCONTD: tirzepatide  (MOUNJARO ) 12.5 MG/0.5ML Pen    Sig: Inject 12.5 mg into the skin once a week.    Dispense:  6 mL    Refill:  2    Dx code E11.65    oxyCODONE -acetaminophen  (PERCOCET) 10-325 MG tablet    Sig: Take 1 tablet by mouth 2 (  two) times daily as needed for pain.    Dispense:  60 tablet    Refill:  0    For refill   tirzepatide  (MOUNJARO ) 12.5 MG/0.5ML Pen    Sig: Inject 12.5 mg into the skin once a week.    Dispense:  6 mL    Refill:  2    Dx code E11.65    Return for previously scheduled, CPE, Issam Carlyon PCP in november.   Total time spent:30 Minutes Time spent includes review of chart, medications, test results, and follow up plan with the patient.   [place] Controlled Substance Database was reviewed by me.  This patient was seen by Mardy Maxin, FNP-C in collaboration with Dr. Sigrid Bathe as a part of collaborative care agreement.   Gaylon Bentz R. Maxin, MSN, FNP-C Internal medicine

## 2024-04-24 LAB — MICROALBUMIN / CREATININE URINE RATIO
Creatinine, Urine: 85 mg/dL
Microalb/Creat Ratio: 12 mg/g{creat} (ref 0–29)
Microalbumin, Urine: 9.8 ug/mL

## 2024-05-05 NOTE — Progress Notes (Signed)
 Clinton Moore                                          MRN: [id number]   05/05/2024   The [facility] Quality Team Specialist reviewed this patient medical record for the purposes of chart review for care gap closure. The following were reviewed: abstraction for care gap closure-glycemic status assessment.    [facility] Quality Team

## 2024-05-27 ENCOUNTER — Telehealth: Payer: Self-pay | Admitting: Nurse Practitioner

## 2024-05-27 ENCOUNTER — Encounter: Payer: Self-pay | Admitting: Nurse Practitioner

## 2024-05-27 DIAGNOSIS — E119 Type 2 diabetes mellitus without complications: Secondary | ICD-10-CM | POA: Insufficient documentation

## 2024-05-27 NOTE — Telephone Encounter (Signed)
 Left vm and sent mychart message to confirm 06/03/24 appointment-Toni

## 2024-06-03 ENCOUNTER — Encounter: Payer: BC Managed Care – PPO | Admitting: Nurse Practitioner

## 2024-06-03 ENCOUNTER — Encounter: Payer: Self-pay | Admitting: Nurse Practitioner

## 2024-06-03 ENCOUNTER — Ambulatory Visit (INDEPENDENT_AMBULATORY_CARE_PROVIDER_SITE_OTHER): Admitting: Nurse Practitioner

## 2024-06-03 VITALS — BP 132/84 | HR 100 | Temp 97.1°F | Resp 16 | Ht 75.0 in | Wt 220.4 lb

## 2024-06-03 DIAGNOSIS — Z0001 Encounter for general adult medical examination with abnormal findings: Secondary | ICD-10-CM | POA: Diagnosis not present

## 2024-06-03 DIAGNOSIS — E1169 Type 2 diabetes mellitus with other specified complication: Secondary | ICD-10-CM | POA: Diagnosis not present

## 2024-06-03 DIAGNOSIS — M5442 Lumbago with sciatica, left side: Secondary | ICD-10-CM | POA: Diagnosis not present

## 2024-06-03 DIAGNOSIS — M5441 Lumbago with sciatica, right side: Secondary | ICD-10-CM

## 2024-06-03 DIAGNOSIS — Z125 Encounter for screening for malignant neoplasm of prostate: Secondary | ICD-10-CM | POA: Diagnosis not present

## 2024-06-03 DIAGNOSIS — F5101 Primary insomnia: Secondary | ICD-10-CM

## 2024-06-03 DIAGNOSIS — Z794 Long term (current) use of insulin: Secondary | ICD-10-CM

## 2024-06-03 DIAGNOSIS — E785 Hyperlipidemia, unspecified: Secondary | ICD-10-CM

## 2024-06-03 DIAGNOSIS — E559 Vitamin D deficiency, unspecified: Secondary | ICD-10-CM

## 2024-06-03 DIAGNOSIS — G8929 Other chronic pain: Secondary | ICD-10-CM

## 2024-06-03 DIAGNOSIS — I152 Hypertension secondary to endocrine disorders: Secondary | ICD-10-CM

## 2024-06-03 MED ORDER — TRAZODONE HCL 50 MG PO TABS: 50.0000 mg | ORAL_TABLET | Freq: Every day | ORAL | 3 refills | Status: AC

## 2024-06-03 MED ORDER — OXYCODONE-ACETAMINOPHEN 10-325 MG PO TABS: 1.0000 | ORAL_TABLET | Freq: Two times a day (BID) | ORAL | 0 refills | Status: DC | PRN

## 2024-06-03 NOTE — Progress Notes (Signed)
 [facility] [address] [place], [place] [address]  Internal MEDICINE  Office Visit Note  Patient Name: Clinton Moore  [id number]  [id number]  Date of Service: 06/03/2024  Chief Complaint  Patient presents with   Depression   Diabetes   Gastroesophageal Reflux   Hypertension   Annual Exam    HPI [name] presents for an annual well visit and physical exam.  Well-appearing 58 y.o. male with diabetes, hypertension, GERD, and high cholestero  Routine CRC screening: cologuard done in December 2024 and was negative. Due again in December 2027 [facility]  foot exam: done today Labs: due for routine labs  New or worsening pain: chronic low back pain, takes pain medication as needed.  Other concerns: none    Current Medication: Outpatient Encounter Medications as of 06/03/2024  Medication Sig Note   traZODone  (DESYREL ) 50 MG tablet Take 1-2 tablets (50-100 mg total) by mouth at bedtime.    Accu-Chek Softclix Lancets lancets Use as instructed once a daily DX E11.65    amLODipine  (NORVASC ) 10 MG tablet TAKE 1 TABLET DAILY    baclofen  (LIORESAL ) 20 MG tablet Take 1 tablet (20 mg total) by mouth 3 (three) times daily as needed for muscle spasms.    Blood Glucose Monitoring Suppl (ACCU-CHEK GUIDE ME) w/Device KIT Use as directed to check glucose DX E11.65    Continuous Glucose Sensor (DEXCOM G7 SENSOR) MISC Apply 1 sensor every 10 days to the skin for CGM for type 2 diabetes E11.65    esomeprazole  (NEXIUM ) 40 MG capsule Take 1 capsule (40 mg total) by mouth daily.    FARXIGA  10 MG TABS tablet TAKE 1 TABLET DAILY BEFORE BREAKFAST    fluticasone  (FLONASE ) 50 MCG/ACT nasal spray Place 1-2 sprays into both nostrils daily as needed. For allergies.    glimepiride  (AMARYL ) 4 MG tablet TAKE 1 TABLET DAILY    glucose blood (ACCU-CHEK GUIDE) test strip Use as instructed once daily DX e11.65    Melatonin-Pyridoxine 5-1 MG TABS Take 1 tablet by mouth at  bedtime as needed. For sleep. 11/08/2023: Taking PRN   montelukast  (SINGULAIR ) 10 MG tablet Take 1 tablet (10 mg total) by mouth at bedtime.    Multiple Vitamin (MULTIVITAMIN WITH MINERALS) TABS tablet Take 1 tablet by mouth daily. men multivitamin    oxyCODONE -acetaminophen  (PERCOCET) 10-325 MG tablet Take 1 tablet by mouth 2 (two) times daily as needed for pain.    rosuvastatin  (CRESTOR ) 10 MG tablet Take 1 tablet by mouth twice weekly for high cholesterol    tirzepatide  (MOUNJARO ) 12.5 MG/0.5ML Pen Inject 12.5 mg into the skin once a week.    [DISCONTINUED] oxyCODONE -acetaminophen  (PERCOCET) 10-325 MG tablet Take 1 tablet by mouth 2 (two) times daily as needed for pain.    No facility-administered encounter medications on file as of 06/03/2024.    Surgical History: Past Surgical History:  Procedure Laterality Date   FOOT MASS EXCISION Right 2007   PLANTAR FASCIA RELEASE Left 06/09/2016   Procedure: Radical excision plantar fibromas of left plantar arch and left hallux;  Surgeon: Krystal Rosella, DPM;  Location: [facility] ORS;  Service: Podiatry;  Laterality: Left;    Medical History: Past Medical History:  Diagnosis Date   Anxiety    Depression    Diabetes mellitus without complication (HCC)    GERD (gastroesophageal reflux disease)    Headache    history of migraines   Hypertension     Family History: Family History  Problem Relation  Age of Onset   Diabetes Father    Hypertension Father     Social History   Socioeconomic History   Marital status: Married    Spouse name: Not on file   Number of children: Not on file   Years of education: Not on file   Highest education level: Not on file  Occupational History   Not on file  Tobacco Use   Smoking status: Some Days    Types: Cigarettes   Smokeless tobacco: Never   Tobacco comments:    social smoker   Substance and Sexual Activity   Alcohol use: Yes    Alcohol/week: 3.0 standard drinks of alcohol    Types: 3 Cans of beer  per week   Drug use: No   Sexual activity: Never  Other Topics Concern   Not on file  Social History Narrative   Not on file   Social Drivers of Health   Financial Resource Strain: Not on file  Food Insecurity: Not on file  Transportation Needs: Not on file  Physical Activity: Not on file  Stress: Not on file  Social Connections: Not on file  Intimate Partner Violence: Not on file      Review of Systems  Constitutional:  Negative for activity change, appetite change, chills, fatigue, fever and unexpected weight change.  HENT: Negative.  Negative for congestion, ear pain, rhinorrhea, sore throat and trouble swallowing.   Eyes: Negative.   Respiratory: Negative.  Negative for cough, chest tightness, shortness of breath and wheezing.   Cardiovascular: Negative.  Negative for chest pain and palpitations.  Gastrointestinal: Negative.  Negative for abdominal pain, blood in stool, constipation, diarrhea, nausea and vomiting.  Endocrine: Negative.   Genitourinary: Negative.  Negative for difficulty urinating, dysuria, frequency, hematuria and urgency.  Musculoskeletal: Negative.  Negative for arthralgias, back pain, joint swelling, myalgias and neck pain.  Skin: Negative.  Negative for rash and wound.  Allergic/Immunologic: Negative.  Negative for immunocompromised state.  Neurological: Negative.  Negative for dizziness, seizures, numbness and headaches.  Hematological: Negative.   Psychiatric/Behavioral: Negative.  Negative for behavioral problems, self-injury and suicidal ideas. The patient is not nervous/anxious.     Vital Signs: BP 132/84   Pulse 100   Temp (!) 97.1 F (36.2 C)   Resp 16   Ht 6' 3 (1.905 m)   Wt 220 lb 6.4 oz (100 kg)   SpO2 97%   BMI 27.55 kg/m    Physical Exam Vitals reviewed.  Constitutional:      General: He is awake. He is not in acute distress.    Appearance: Normal appearance. He is well-developed and well-groomed. He is obese. He is not  ill-appearing or diaphoretic.  HENT:     Head: Normocephalic and atraumatic.     Right Ear: Tympanic membrane, ear canal and external ear normal.     Left Ear: Tympanic membrane, ear canal and external ear normal.     Nose: Nose normal. No congestion or rhinorrhea.     Mouth/Throat:     Lips: Pink.     Mouth: Mucous membranes are moist.     Pharynx: Oropharynx is clear. Uvula midline. No oropharyngeal exudate or posterior oropharyngeal erythema.  Eyes:     General: Lids are normal. Vision grossly intact. Gaze aligned appropriately. No scleral icterus.       Right eye: No discharge.        Left eye: No discharge.     Extraocular Movements: Extraocular movements intact.  Conjunctiva/sclera: Conjunctivae normal.     Pupils: Pupils are equal, round, and reactive to light.     Funduscopic exam:    Right eye: Red reflex present.        Left eye: Red reflex present. Neck:     Thyroid: No thyromegaly.     Vascular: No JVD.     Trachea: Trachea and phonation normal. No tracheal deviation.  Cardiovascular:     Rate and Rhythm: Normal rate and regular rhythm.     Pulses:          Carotid pulses are 3+ on the right side and 3+ on the left side.      Radial pulses are 2+ on the right side and 2+ on the left side.       Dorsalis pedis pulses are 2+ on the right side and 2+ on the left side.       Posterior tibial pulses are 2+ on the right side and 2+ on the left side.     Heart sounds: Normal heart sounds, S1 normal and S2 normal. No murmur heard.    No friction rub. No gallop.  Pulmonary:     Effort: Pulmonary effort is normal. No accessory muscle usage or respiratory distress.     Breath sounds: Normal breath sounds and air entry. No stridor. No wheezing or rales.  Chest:     Chest wall: No tenderness.  Abdominal:     General: Bowel sounds are normal. There is no distension.     Palpations: Abdomen is soft. There is no shifting dullness, fluid wave, mass or pulsatile mass.      Tenderness: There is no abdominal tenderness. There is no guarding or rebound.  Musculoskeletal:        General: No tenderness or deformity. Normal range of motion.     Cervical back: Normal range of motion and neck supple.     Right foot: Normal range of motion. No deformity, bunion, Charcot foot, foot drop or prominent metatarsal heads.     Left foot: Normal range of motion. No deformity, bunion, Charcot foot, foot drop or prominent metatarsal heads.  Feet:     Right foot:     Protective Sensation: 6 sites tested.  6 sites sensed.     Skin integrity: Dry skin present. No ulcer, blister, skin breakdown, erythema, warmth, callus or fissure.     Toenail Condition: Right toenails are long.     Left foot:     Protective Sensation: 6 sites tested.  6 sites sensed.     Skin integrity: Dry skin present. No ulcer, blister, skin breakdown, erythema, warmth, callus or fissure.     Toenail Condition: Left toenails are long.  Lymphadenopathy:     Cervical: No cervical adenopathy.  Skin:    General: Skin is warm and dry.     Capillary Refill: Capillary refill takes less than 2 seconds.     Coloration: Skin is not pale.     Findings: No erythema or rash.  Neurological:     Mental Status: He is alert and oriented to person, place, and time.     Cranial Nerves: No cranial nerve deficit.     Motor: No abnormal muscle tone.     Coordination: Coordination normal.     Gait: Gait normal.     Deep Tendon Reflexes: Reflexes are normal and symmetric.  Psychiatric:        Mood and Affect: Mood and affect normal.  Speech: Speech normal.        Behavior: Behavior normal. Behavior is cooperative.        Thought Content: Thought content normal.        Judgment: Judgment normal.        Assessment/Plan: 1. Encounter for routine adult health examination with abnormal findings (Primary) Age-appropriate preventive screenings and vaccinations discussed, annual physical exam completed. Routine labs for  health maintenance ordered, see below. PHM updated.   - CBC with Differential/Platelet - CMP14+EGFR - Lipid Profile - Vitamin D  (25 hydroxy)  2. Type 2 diabetes mellitus with other specified complication, with long-term current use of insulin  (HCC) Most recent A1c is slightly elevated but had greatly improved to 7.6 in September. Routine labs ordered. Continue mounjaro  and glimepiride  as prescribed. Continue use of dexcom sensors.  - CBC with Differential/Platelet - CMP14+EGFR - Lipid Profile  3. Hypertension associated with type 2 diabetes mellitus (HCC) Stable, continue amlodipine  as prescribed. Routine labs ordered  - CBC with Differential/Platelet - CMP14+EGFR - Lipid Profile  4. Hyperlipidemia associated with type 2 diabetes mellitus (HCC) Routine labs ordered. Continue rosuvastatin  as prescribed.  - CBC with Differential/Platelet - CMP14+EGFR - Lipid Profile  5. Chronic bilateral low back pain with bilateral sciatica Take percocet as needed for back pain  - oxyCODONE -acetaminophen  (PERCOCET) 10-325 MG tablet; Take 1 tablet by mouth 2 (two) times daily as needed for pain.  Dispense: 60 tablet; Refill: 0  6. Vitamin D  deficiency Routine lab ordered  - Vitamin D  (25 hydroxy)  7. Screening for prostate cancer Routine lab ordered  - PSA Total (Reflex To Free)  8. Primary insomnia Start trazodone  as prescribed.  - traZODone  (DESYREL ) 50 MG tablet; Take 1-2 tablets (50-100 mg total) by mouth at bedtime.  Dispense: 180 tablet; Refill: 3      General Counseling: Elsie oakland understanding of the findings of todays visit and agrees with plan of treatment. I have discussed any further diagnostic evaluation that may be needed or ordered today. We also reviewed his medications today. he has been encouraged to call the office with any questions or concerns that should arise related to todays visit.    Orders Placed This Encounter  Procedures   CBC with  Differential/Platelet   CMP14+EGFR   Lipid Profile   PSA Total (Reflex To Free)   Vitamin D  (25 hydroxy)    Meds ordered this encounter  Medications   traZODone  (DESYREL ) 50 MG tablet    Sig: Take 1-2 tablets (50-100 mg total) by mouth at bedtime.    Dispense:  180 tablet    Refill:  3    Fill new script today, discontinue mirtazapine    oxyCODONE -acetaminophen  (PERCOCET) 10-325 MG tablet    Sig: Take 1 tablet by mouth 2 (two) times daily as needed for pain.    Dispense:  60 tablet    Refill:  0    For refill    Return in about 1 month (around 07/03/2024) for F/U, Labs, Gale Klar PCP.   Total time spent:30 Minutes Time spent includes review of chart, medications, test results, and follow up plan with the patient.   [place] Controlled Substance Database was reviewed by me.  This patient was seen by Mardy Maxin, FNP-C in collaboration with Dr. Sigrid Bathe as a part of collaborative care agreement.  Merwin Breden R. Maxin, MSN, FNP-C Internal medicine

## 2024-06-13 DIAGNOSIS — Z125 Encounter for screening for malignant neoplasm of prostate: Secondary | ICD-10-CM | POA: Diagnosis not present

## 2024-06-13 DIAGNOSIS — Z794 Long term (current) use of insulin: Secondary | ICD-10-CM | POA: Diagnosis not present

## 2024-06-13 DIAGNOSIS — E1159 Type 2 diabetes mellitus with other circulatory complications: Secondary | ICD-10-CM | POA: Diagnosis not present

## 2024-06-13 DIAGNOSIS — E785 Hyperlipidemia, unspecified: Secondary | ICD-10-CM | POA: Diagnosis not present

## 2024-06-13 DIAGNOSIS — E559 Vitamin D deficiency, unspecified: Secondary | ICD-10-CM | POA: Diagnosis not present

## 2024-06-13 DIAGNOSIS — E1169 Type 2 diabetes mellitus with other specified complication: Secondary | ICD-10-CM | POA: Diagnosis not present

## 2024-06-13 DIAGNOSIS — I152 Hypertension secondary to endocrine disorders: Secondary | ICD-10-CM | POA: Diagnosis not present

## 2024-06-14 LAB — CBC WITH DIFFERENTIAL/PLATELET
Basophils Absolute: 0 x10E3/uL (ref 0.0–0.2)
Basos: 1 %
EOS (ABSOLUTE): 0.1 x10E3/uL (ref 0.0–0.4)
Eos: 1 %
Hematocrit: 49.7 % (ref 37.5–51.0)
Hemoglobin: 15.9 g/dL (ref 13.0–17.7)
Immature Grans (Abs): 0 x10E3/uL (ref 0.0–0.1)
Immature Granulocytes: 0 %
Lymphocytes Absolute: 1.2 x10E3/uL (ref 0.7–3.1)
Lymphs: 24 %
MCH: 28.3 pg (ref 26.6–33.0)
MCHC: 32 g/dL (ref 31.5–35.7)
MCV: 88 fL (ref 79–97)
Monocytes Absolute: 0.5 x10E3/uL (ref 0.1–0.9)
Monocytes: 10 %
Neutrophils Absolute: 3.2 x10E3/uL (ref 1.4–7.0)
Neutrophils: 64 %
Platelets: 207 x10E3/uL (ref 150–450)
RBC: 5.62 x10E6/uL (ref 4.14–5.80)
RDW: 15.7 % — ABNORMAL HIGH (ref 11.6–15.4)
WBC: 5 x10E3/uL (ref 3.4–10.8)

## 2024-06-14 LAB — CMP14+EGFR
ALT: 37 IU/L (ref 0–44)
AST: 27 IU/L (ref 0–40)
Albumin: 4.6 g/dL (ref 3.8–4.9)
Alkaline Phosphatase: 114 IU/L (ref 47–123)
BUN/Creatinine Ratio: 14 (ref 9–20)
BUN: 12 mg/dL (ref 6–24)
Bilirubin Total: 0.5 mg/dL (ref 0.0–1.2)
CO2: 24 mmol/L (ref 20–29)
Calcium: 9.3 mg/dL (ref 8.7–10.2)
Chloride: 102 mmol/L (ref 96–106)
Creatinine, Ser: 0.84 mg/dL (ref 0.76–1.27)
Globulin, Total: 2.5 g/dL (ref 1.5–4.5)
Glucose: 149 mg/dL — ABNORMAL HIGH (ref 70–99)
Potassium: 4 mmol/L (ref 3.5–5.2)
Sodium: 141 mmol/L (ref 134–144)
Total Protein: 7.1 g/dL (ref 6.0–8.5)
eGFR: 102 mL/min/1.73 (ref >59)

## 2024-06-14 LAB — PSA TOTAL (REFLEX TO FREE): Prostate Specific Ag, Serum: 0.5 ng/mL (ref 0.0–4.0)

## 2024-06-14 LAB — LIPID PANEL
Chol/HDL Ratio: 2.8 ratio (ref 0.0–5.0)
Cholesterol, Total: 144 mg/dL (ref 100–199)
HDL: 51 mg/dL (ref >39)
LDL Chol Calc (NIH): 82 mg/dL (ref 0–99)
Triglycerides: 50 mg/dL (ref 0–149)
VLDL Cholesterol Cal: 11 mg/dL (ref 5–40)

## 2024-06-14 LAB — VITAMIN D 25 HYDROXY (VIT D DEFICIENCY, FRACTURES): Vit D, 25-Hydroxy: 33.3 ng/mL (ref 30.0–100.0)

## 2024-06-17 ENCOUNTER — Ambulatory Visit: Payer: Self-pay | Admitting: Nurse Practitioner

## 2024-06-17 NOTE — Progress Notes (Signed)
 We will discuss these results at his upcoming appt in a couple of weeks.

## 2024-06-24 ENCOUNTER — Encounter: Payer: Self-pay | Admitting: Nurse Practitioner

## 2024-06-24 DIAGNOSIS — E119 Type 2 diabetes mellitus without complications: Secondary | ICD-10-CM | POA: Insufficient documentation

## 2024-07-03 ENCOUNTER — Encounter: Payer: Self-pay | Admitting: Nurse Practitioner

## 2024-07-03 ENCOUNTER — Ambulatory Visit: Admitting: Nurse Practitioner

## 2024-07-03 VITALS — BP 138/88 | HR 96 | Temp 97.4°F | Resp 16 | Ht 75.0 in | Wt 217.4 lb

## 2024-07-03 DIAGNOSIS — M5442 Lumbago with sciatica, left side: Secondary | ICD-10-CM

## 2024-07-03 DIAGNOSIS — E785 Hyperlipidemia, unspecified: Secondary | ICD-10-CM

## 2024-07-03 DIAGNOSIS — M5441 Lumbago with sciatica, right side: Secondary | ICD-10-CM

## 2024-07-03 DIAGNOSIS — I152 Hypertension secondary to endocrine disorders: Secondary | ICD-10-CM

## 2024-07-03 DIAGNOSIS — E1159 Type 2 diabetes mellitus with other circulatory complications: Secondary | ICD-10-CM

## 2024-07-03 DIAGNOSIS — E559 Vitamin D deficiency, unspecified: Secondary | ICD-10-CM | POA: Diagnosis not present

## 2024-07-03 DIAGNOSIS — E1169 Type 2 diabetes mellitus with other specified complication: Secondary | ICD-10-CM | POA: Diagnosis not present

## 2024-07-03 DIAGNOSIS — G8929 Other chronic pain: Secondary | ICD-10-CM

## 2024-07-03 MED ORDER — OXYCODONE-ACETAMINOPHEN 10-325 MG PO TABS: 1.0000 | ORAL_TABLET | Freq: Two times a day (BID) | ORAL | 0 refills | Status: DC | PRN

## 2024-07-03 NOTE — Progress Notes (Signed)
 [facility] [address] [place], [place] [address]  Internal MEDICINE  Office Visit Note  Patient Name: Clinton Moore  [id number]  [id number]  Date of Service: 07/03/2024  Chief Complaint  Patient presents with   Depression   Diabetes   Gastroesophageal Reflux   Hypertension    HPI Clinton Moore presents for a follow-up visit for lab results.  Vitamin D  level has improved to normal range Cholesterol panel is normal now, triglycerides significantly improved, LDL significantly improved. HDL increased by 11.  Fasting glucose decreased to 149, kidney function is normal, liver enzymes improved to normal range.  Urine microalbumin creatinine ratio was normal in September. Last A1c was improving in late September to 7.6   Current Medication: Outpatient Encounter Medications as of 07/03/2024  Medication Sig Note   Accu-Chek Softclix Lancets lancets Use as instructed once a daily DX E11.65    amLODipine  (NORVASC ) 10 MG tablet TAKE 1 TABLET DAILY    baclofen  (LIORESAL ) 20 MG tablet Take 1 tablet (20 mg total) by mouth 3 (three) times daily as needed for muscle spasms.    Blood Glucose Monitoring Suppl (ACCU-CHEK GUIDE ME) w/Device KIT Use as directed to check glucose DX E11.65    Continuous Glucose Sensor (DEXCOM G7 SENSOR) MISC Apply 1 sensor every 10 days to the skin for CGM for type 2 diabetes E11.65    esomeprazole  (NEXIUM ) 40 MG capsule Take 1 capsule (40 mg total) by mouth daily.    FARXIGA  10 MG TABS tablet TAKE 1 TABLET DAILY BEFORE BREAKFAST    fluticasone  (FLONASE ) 50 MCG/ACT nasal spray Place 1-2 sprays into both nostrils daily as needed. For allergies.    glimepiride  (AMARYL ) 4 MG tablet TAKE 1 TABLET DAILY    glucose blood (ACCU-CHEK GUIDE) test strip Use as instructed once daily DX e11.65    Melatonin-Pyridoxine 5-1 MG TABS Take 1 tablet by mouth at bedtime as needed. For sleep. 11/08/2023: Taking PRN   montelukast  (SINGULAIR ) 10 MG tablet Take 1 tablet (10 mg  total) by mouth at bedtime.    Multiple Vitamin (MULTIVITAMIN WITH MINERALS) TABS tablet Take 1 tablet by mouth daily. men multivitamin    oxyCODONE -acetaminophen  (PERCOCET) 10-325 MG tablet Take 1 tablet by mouth 2 (two) times daily as needed for pain.    rosuvastatin  (CRESTOR ) 10 MG tablet Take 1 tablet by mouth twice weekly for high cholesterol    tirzepatide  (MOUNJARO ) 12.5 MG/0.5ML Pen Inject 12.5 mg into the skin once a week.    traZODone  (DESYREL ) 50 MG tablet Take 1-2 tablets (50-100 mg total) by mouth at bedtime.    [DISCONTINUED] oxyCODONE -acetaminophen  (PERCOCET) 10-325 MG tablet Take 1 tablet by mouth 2 (two) times daily as needed for pain.    No facility-administered encounter medications on file as of 07/03/2024.    Surgical History: Past Surgical History:  Procedure Laterality Date   FOOT MASS EXCISION Right 2007   PLANTAR FASCIA RELEASE Left 06/09/2016   Procedure: Radical excision plantar fibromas of left plantar arch and left hallux;  Surgeon: Krystal Rosella, DPM;  Location: [facility] ORS;  Service: Podiatry;  Laterality: Left;    Medical History: Past Medical History:  Diagnosis Date   Anxiety    Depression    Diabetes mellitus without complication (HCC)    GERD (gastroesophageal reflux disease)    Headache    history of migraines   Hypertension     Family History: Family History  Problem Relation Age of Onset   Diabetes Father  Hypertension Father     Social History   Socioeconomic History   Marital status: Married    Spouse name: Not on file   Number of children: Not on file   Years of education: Not on file   Highest education level: Not on file  Occupational History   Not on file  Tobacco Use   Smoking status: Some Days    Types: Cigarettes   Smokeless tobacco: Never   Tobacco comments:    social smoker   Substance and Sexual Activity   Alcohol use: Yes    Alcohol/week: 3.0 standard drinks of alcohol    Types: 3 Cans of beer per week   Drug  use: No   Sexual activity: Never  Other Topics Concern   Not on file  Social History Narrative   Not on file   Social Drivers of Health   Financial Resource Strain: Not on file  Food Insecurity: Not on file  Transportation Needs: Not on file  Physical Activity: Not on file  Stress: Not on file  Social Connections: Not on file  Intimate Partner Violence: Not on file      Review of Systems  Constitutional:  Negative for chills, fatigue and unexpected weight change.  HENT:  Negative for congestion, rhinorrhea, sneezing and sore throat.   Eyes:  Negative for redness.  Respiratory: Negative.  Negative for cough, chest tightness and shortness of breath.   Cardiovascular: Negative.  Negative for chest pain and palpitations.  Gastrointestinal:  Negative for abdominal pain, constipation, diarrhea, nausea and vomiting.  Genitourinary:  Negative for dysuria and frequency.  Musculoskeletal:  Positive for back pain (chronic) and myalgias. Negative for arthralgias, joint swelling and neck pain.  Skin:  Negative for rash.  Neurological: Negative.  Negative for tremors and numbness.  Hematological:  Negative for adenopathy. Does not bruise/bleed easily.  Psychiatric/Behavioral:  Negative for behavioral problems (Depression), sleep disturbance and suicidal ideas. The patient is not nervous/anxious.     Vital Signs: BP 138/88   Pulse 96   Temp (!) 97.4 F (36.3 C)   Resp 16   Ht 6' 3 (1.905 m)   Wt 217 lb 6.4 oz (98.6 kg)   SpO2 99%   BMI 27.17 kg/m    Physical Exam Vitals reviewed.  Constitutional:      General: He is not in acute distress.    Appearance: Normal appearance. He is obese. He is not ill-appearing.  HENT:     Head: Normocephalic and atraumatic.  Eyes:     Pupils: Pupils are equal, round, and reactive to light.  Cardiovascular:     Rate and Rhythm: Normal rate and regular rhythm.  Pulmonary:     Effort: Pulmonary effort is normal. No respiratory distress.   Musculoskeletal:     Lumbar back: Spasms and tenderness present. Decreased range of motion.  Neurological:     Mental Status: He is alert and oriented to person, place, and time.  Psychiatric:        Mood and Affect: Mood normal.        Behavior: Behavior normal.        Assessment/Plan: 1. Type 2 diabetes mellitus with other specified complication, without long-term current use of insulin  (HCC) (Primary) A1c is still slightly elevated but has greatly improved in the past several months. We will follow up in 2 months to repeat his A1c in the office. Continue mounjaro , farxiga , and glimepiride   2. Hypertension associated with type 2 diabetes mellitus (HCC) Stable,  continue amlodipine  as prescribed.   3. Hyperlipidemia associated with type 2 diabetes mellitus (HCC) Continue rosuvastatin  as prescribed.   4. Chronic bilateral low back pain with bilateral sciatica Continue prn percocet as prescribed.  - oxyCODONE -acetaminophen  (PERCOCET) 10-325 MG tablet; Take 1 tablet by mouth 2 (two) times daily as needed for pain.  Dispense: 60 tablet; Refill: 0  5. Vitamin D  deficiency May continue OTC vitamin D  supplement as discussed.    General Counseling: alphonse asbridge understanding of the findings of todays visit and agrees with plan of treatment. I have discussed any further diagnostic evaluation that may be needed or ordered today. We also reviewed his medications today. he has been encouraged to call the office with any questions or concerns that should arise related to todays visit.    No orders of the defined types were placed in this encounter.   Meds ordered this encounter  Medications   oxyCODONE -acetaminophen  (PERCOCET) 10-325 MG tablet    Sig: Take 1 tablet by mouth 2 (two) times daily as needed for pain.    Dispense:  60 tablet    Refill:  0    For refill    Return in about 2 months (around 09/03/2024) for F/U, Recheck A1C, Angelyn Osterberg PCP.   Total time spent:30  Minutes Time spent includes review of chart, medications, test results, and follow up plan with the patient.   [place] Controlled Substance Database was reviewed by me.  This patient was seen by Mardy Maxin, FNP-C in collaboration with Dr. Sigrid Bathe as a part of collaborative care agreement.   Kinsey Karch R. Maxin, MSN, FNP-C Internal medicine

## 2024-07-04 ENCOUNTER — Encounter: Payer: Self-pay | Admitting: Nurse Practitioner

## 2024-07-04 DIAGNOSIS — E559 Vitamin D deficiency, unspecified: Secondary | ICD-10-CM | POA: Insufficient documentation

## 2024-09-01 ENCOUNTER — Other Ambulatory Visit: Payer: Self-pay

## 2024-09-01 DIAGNOSIS — G8929 Other chronic pain: Secondary | ICD-10-CM

## 2024-09-01 MED ORDER — BACLOFEN 20 MG PO TABS: 20.0000 mg | ORAL_TABLET | Freq: Three times a day (TID) | ORAL | 1 refills | Status: AC | PRN

## 2024-09-04 ENCOUNTER — Ambulatory Visit (INDEPENDENT_AMBULATORY_CARE_PROVIDER_SITE_OTHER): Admitting: Nurse Practitioner

## 2024-09-04 ENCOUNTER — Encounter: Payer: Self-pay | Admitting: Nurse Practitioner

## 2024-09-04 VITALS — BP 138/80 | HR 86 | Temp 98.1°F | Resp 16 | Ht 75.0 in | Wt 216.8 lb

## 2024-09-04 DIAGNOSIS — I152 Hypertension secondary to endocrine disorders: Secondary | ICD-10-CM | POA: Diagnosis not present

## 2024-09-04 DIAGNOSIS — E785 Hyperlipidemia, unspecified: Secondary | ICD-10-CM

## 2024-09-04 DIAGNOSIS — G8929 Other chronic pain: Secondary | ICD-10-CM

## 2024-09-04 DIAGNOSIS — E1159 Type 2 diabetes mellitus with other circulatory complications: Secondary | ICD-10-CM

## 2024-09-04 DIAGNOSIS — M5442 Lumbago with sciatica, left side: Secondary | ICD-10-CM

## 2024-09-04 DIAGNOSIS — E1169 Type 2 diabetes mellitus with other specified complication: Secondary | ICD-10-CM | POA: Diagnosis not present

## 2024-09-04 DIAGNOSIS — M5441 Lumbago with sciatica, right side: Secondary | ICD-10-CM | POA: Diagnosis not present

## 2024-09-04 LAB — POCT GLYCOSYLATED HEMOGLOBIN (HGB A1C): Hemoglobin A1C: 7.5 % — AB (ref 4.0–5.6)

## 2024-09-04 MED ORDER — OXYCODONE-ACETAMINOPHEN 10-325 MG PO TABS: 1.0000 | ORAL_TABLET | Freq: Two times a day (BID) | ORAL | 0 refills | Status: AC | PRN

## 2024-09-04 NOTE — Progress Notes (Cosign Needed)
 [facility] [address] [place], [place] [address]  Internal MEDICINE  Office Visit Note  Patient Name: Clinton Moore  [id number]  [id number]  Date of Service: 09/04/2024  Chief Complaint  Patient presents with   Depression   Diabetes   Gastroesophageal Reflux   Hypertension   Follow-up    Depression        Associated symptoms include myalgias.  Associated symptoms include no fatigue and no suicidal ideas. Diabetes Pertinent negatives for hypoglycemia include no nervousness/anxiousness or tremors. Pertinent negatives for diabetes include no chest pain and no fatigue.  Gastroesophageal Reflux He reports no abdominal pain, no chest pain, no coughing, no nausea or no sore throat. Pertinent negatives include no fatigue.  Hypertension Pertinent negatives include no chest pain, neck pain, palpitations or shortness of breath.   Clinton Moore presents for a follow-up visit for diabetes, hypertension, high cholesterol, and chronic back pain. \ Diabetes -- A1c is slightly improved to 7.5 today from 7.6 in September last year. Currently taking mounjaro , farxiga  and glimepiride  Hypertension -- controlled with amlodipine  High cholesterol -- taking rosuvastatin  daily.  Chronic back pain with sciatica -- takes percocet as needed.    Current Medication: Outpatient Encounter Medications as of 09/04/2024  Medication Sig Note   Accu-Chek Softclix Lancets lancets Use as instructed once a daily DX E11.65    amLODipine  (NORVASC ) 10 MG tablet TAKE 1 TABLET DAILY    baclofen  (LIORESAL ) 20 MG tablet Take 1 tablet (20 mg total) by mouth 3 (three) times daily as needed for muscle spasms.    Blood Glucose Monitoring Suppl (ACCU-CHEK GUIDE ME) w/Device KIT Use as directed to check glucose DX E11.65    Continuous Glucose Sensor (DEXCOM G7 SENSOR) MISC Apply 1 sensor every 10 days to the skin for CGM for type 2 diabetes E11.65    esomeprazole  (NEXIUM ) 40 MG capsule Take 1 capsule (40 mg total)  by mouth daily.    FARXIGA  10 MG TABS tablet TAKE 1 TABLET DAILY BEFORE BREAKFAST    fluticasone  (FLONASE ) 50 MCG/ACT nasal spray Place 1-2 sprays into both nostrils daily as needed. For allergies.    glimepiride  (AMARYL ) 4 MG tablet TAKE 1 TABLET DAILY    glucose blood (ACCU-CHEK GUIDE) test strip Use as instructed once daily DX e11.65    Melatonin-Pyridoxine 5-1 MG TABS Take 1 tablet by mouth at bedtime as needed. For sleep. 11/08/2023: Taking PRN   montelukast  (SINGULAIR ) 10 MG tablet Take 1 tablet (10 mg total) by mouth at bedtime.    Multiple Vitamin (MULTIVITAMIN WITH MINERALS) TABS tablet Take 1 tablet by mouth daily. men multivitamin    oxyCODONE -acetaminophen  (PERCOCET) 10-325 MG tablet Take 1 tablet by mouth 2 (two) times daily as needed for pain.    rosuvastatin  (CRESTOR ) 10 MG tablet Take 1 tablet by mouth twice weekly for high cholesterol    tirzepatide  (MOUNJARO ) 12.5 MG/0.5ML Pen Inject 12.5 mg into the skin once a week.    traZODone  (DESYREL ) 50 MG tablet Take 1-2 tablets (50-100 mg total) by mouth at bedtime.    [DISCONTINUED] oxyCODONE -acetaminophen  (PERCOCET) 10-325 MG tablet Take 1 tablet by mouth 2 (two) times daily as needed for pain.    No facility-administered encounter medications on file as of 09/04/2024.    Surgical History: Past Surgical History:  Procedure Laterality Date   FOOT MASS EXCISION Right 2007   PLANTAR FASCIA RELEASE Left 06/09/2016   Procedure: Radical excision plantar fibromas of left plantar arch and left hallux;  Surgeon: Krystal  Neill, DPM;  Location: [facility] ORS;  Service: Podiatry;  Laterality: Left;    Medical History: Past Medical History:  Diagnosis Date   Anxiety    Depression    Diabetes mellitus without complication (HCC)    GERD (gastroesophageal reflux disease)    Headache    history of migraines   Hypertension     Family History: Family History  Problem Relation Age of Onset   Diabetes Father    Hypertension Father     Social  History   Socioeconomic History   Marital status: Married    Spouse name: Not on file   Number of children: Not on file   Years of education: Not on file   Highest education level: Not on file  Occupational History   Not on file  Tobacco Use   Smoking status: Some Days    Types: Cigarettes   Smokeless tobacco: Never   Tobacco comments:    social smoker   Substance and Sexual Activity   Alcohol use: Yes    Alcohol/week: 3.0 standard drinks of alcohol    Types: 3 Cans of beer per week   Drug use: No   Sexual activity: Never  Other Topics Concern   Not on file  Social History Narrative   Not on file   Social Drivers of Health   Tobacco Use: High Risk (09/04/2024)   Patient History    Smoking Tobacco Use: Some Days    Smokeless Tobacco Use: Never    Passive Exposure: Not on file  Financial Resource Strain: Not on file  Food Insecurity: Not on file  Transportation Needs: Not on file  Physical Activity: Not on file  Stress: Not on file  Social Connections: Not on file  Intimate Partner Violence: Not on file  Depression (PHQ2-9): Low Risk (03/02/2022)   Depression (PHQ2-9)    PHQ-2 Score: 0  Alcohol Screen: Low Risk (03/02/2022)   Alcohol Screen    Last Alcohol Screening Score (AUDIT): 2  Housing: Not on file  Utilities: Not on file  Health Literacy: Not on file      Review of Systems  Constitutional:  Negative for chills, fatigue and unexpected weight change.  HENT:  Negative for congestion, rhinorrhea, sneezing and sore throat.   Eyes:  Negative for redness.  Respiratory: Negative.  Negative for cough, chest tightness and shortness of breath.   Cardiovascular: Negative.  Negative for chest pain and palpitations.  Gastrointestinal:  Negative for abdominal pain, constipation, diarrhea, nausea and vomiting.  Genitourinary:  Negative for dysuria and frequency.  Musculoskeletal:  Positive for back pain (chronic) and myalgias. Negative for arthralgias, joint swelling and  neck pain.  Skin:  Negative for rash.  Neurological: Negative.  Negative for tremors and numbness.  Hematological:  Negative for adenopathy. Does not bruise/bleed easily.  Psychiatric/Behavioral:  Positive for depression. Negative for behavioral problems (Depression), sleep disturbance and suicidal ideas. The patient is not nervous/anxious.     Vital Signs: BP 138/80   Pulse 86   Temp 98.1 F (36.7 C)   Resp 16   Ht 6' 3 (1.905 m)   Wt 216 lb 12.8 oz (98.3 kg)   SpO2 97%   BMI 27.10 kg/m    Physical Exam Vitals reviewed.  Constitutional:      General: He is not in acute distress.    Appearance: Normal appearance. He is obese. He is not ill-appearing.  HENT:     Head: Normocephalic and atraumatic.  Eyes:  Pupils: Pupils are equal, round, and reactive to light.  Cardiovascular:     Rate and Rhythm: Normal rate and regular rhythm.  Pulmonary:     Effort: Pulmonary effort is normal. No respiratory distress.  Musculoskeletal:     Lumbar back: Spasms and tenderness present. Decreased range of motion.  Neurological:     Mental Status: He is alert and oriented to person, place, and time.  Psychiatric:        Mood and Affect: Mood normal.        Behavior: Behavior normal.        Assessment/Plan: 1. Type 2 diabetes mellitus with other specified complication, without long-term current use of insulin  (HCC) (Primary) A1c still slightly elevated but continuing to improve. Urine sent for ACR.  - POCT glycosylated hemoglobin (Hb A1C) - Urine Microalbumin w/creat. ratio  2. Hypertension associated with type 2 diabetes mellitus (HCC) Stable, continue amlodipine  as prescribed.   3. Hyperlipidemia associated with type 2 diabetes mellitus (HCC) Continue rosuvastatin  as prescribed.   4. Chronic bilateral low back pain with bilateral sciatica Continue prn percocet as prescribed. Last fill was early December. Follow up for additional refills.  - oxyCODONE -acetaminophen   (PERCOCET) 10-325 MG tablet; Take 1 tablet by mouth 2 (two) times daily as needed for pain.  Dispense: 60 tablet; Refill: 0   General Counseling: Elsie oakland understanding of the findings of todays visit and agrees with plan of treatment. I have discussed any further diagnostic evaluation that may be needed or ordered today. We also reviewed his medications today. he has been encouraged to call the office with any questions or concerns that should arise related to todays visit.    Orders Placed This Encounter  Procedures   POCT glycosylated hemoglobin (Hb A1C)    Meds ordered this encounter  Medications   oxyCODONE -acetaminophen  (PERCOCET) 10-325 MG tablet    Sig: Take 1 tablet by mouth 2 (two) times daily as needed for pain.    Dispense:  60 tablet    Refill:  0    For refill    Return in about 4 months (around 01/02/2025) for F/U, Recheck A1C, Ayden Hardwick PCP.   Total time spent:30 Minutes Time spent includes review of chart, medications, test results, and follow up plan with the patient.   [place] Controlled Substance Database was reviewed by me.  This patient was seen by Mardy Maxin, FNP-C in collaboration with Dr. Sigrid Bathe as a part of collaborative care agreement.   Lafe Clerk R. Maxin, MSN, FNP-C Internal medicine

## 2024-09-05 LAB — MICROALBUMIN / CREATININE URINE RATIO
Creatinine, Urine: 76.6 mg/dL
Microalb/Creat Ratio: 15 mg/g{creat} (ref 0–29)
Microalbumin, Urine: 11.5 ug/mL

## 2025-01-05 ENCOUNTER — Ambulatory Visit: Admitting: Nurse Practitioner

## 2025-06-04 ENCOUNTER — Encounter: Admitting: Nurse Practitioner
